# Patient Record
Sex: Male | Born: 1955
Health system: Southern US, Community
[De-identification: ages and names within clinical notes are randomized; demographics above are authoritative.]

## PROBLEM LIST (undated history)

## (undated) DIAGNOSIS — E785 Hyperlipidemia, unspecified: Secondary | ICD-10-CM

## (undated) DIAGNOSIS — E119 Type 2 diabetes mellitus without complications: Secondary | ICD-10-CM

## (undated) DIAGNOSIS — I10 Essential (primary) hypertension: Secondary | ICD-10-CM

## (undated) DIAGNOSIS — N529 Male erectile dysfunction, unspecified: Secondary | ICD-10-CM

## (undated) DIAGNOSIS — K219 Gastro-esophageal reflux disease without esophagitis: Secondary | ICD-10-CM

## (undated) HISTORY — DX: Hyperlipidemia, unspecified: E78.5

## (undated) HISTORY — DX: Essential (primary) hypertension: I10

## (undated) HISTORY — DX: Type 2 diabetes mellitus without complications: E11.9

## (undated) HISTORY — PX: HERNIA REPAIR: SHX51

## (undated) HISTORY — PX: OTHER SURGICAL HISTORY: SHX169

## (undated) HISTORY — DX: Gastro-esophageal reflux disease without esophagitis: K21.9

## (undated) HISTORY — DX: Male erectile dysfunction, unspecified: N52.9

## (undated) HISTORY — PX: TRIGGER FINGER RELEASE: SHX641

---

## 2011-01-17 HISTORY — PX: SMALL INTESTINE SURGERY: SHX150

## 2016-10-04 ENCOUNTER — Other Ambulatory Visit: Payer: Self-pay | Admitting: Internal Medicine

## 2016-10-04 ENCOUNTER — Ambulatory Visit
Admission: RE | Admit: 2016-10-04 | Discharge: 2016-10-04 | Disposition: A | Discharge status: Home / Self Care | Payer: Self-pay | Source: Ambulatory Visit | Attending: Internal Medicine | Admitting: Internal Medicine

## 2016-10-04 DIAGNOSIS — Z9289 Personal history of other medical treatment: Secondary | ICD-10-CM

## 2016-12-18 ENCOUNTER — Ambulatory Visit (INDEPENDENT_AMBULATORY_CARE_PROVIDER_SITE_OTHER): Payer: No Typology Code available for payment source | Admitting: Family Medicine

## 2016-12-18 ENCOUNTER — Encounter (INDEPENDENT_AMBULATORY_CARE_PROVIDER_SITE_OTHER): Payer: Self-pay | Admitting: Family Medicine

## 2016-12-18 VITALS — BP 164/75 | HR 64 | Temp 98.2°F | Ht 69.0 in | Wt 190.0 lb

## 2016-12-18 DIAGNOSIS — R011 Cardiac murmur, unspecified: Secondary | ICD-10-CM

## 2016-12-18 DIAGNOSIS — Z0000 Encounter for general adult medical examination without abnormal findings: Secondary | ICD-10-CM

## 2016-12-18 DIAGNOSIS — E119 Type 2 diabetes mellitus without complications: Secondary | ICD-10-CM

## 2016-12-18 DIAGNOSIS — R809 Proteinuria, unspecified: Secondary | ICD-10-CM | POA: Insufficient documentation

## 2016-12-18 DIAGNOSIS — I10 Essential (primary) hypertension: Secondary | ICD-10-CM

## 2016-12-18 LAB — POCT HEMOGLOBIN A1C: POCT Hgb A1C: 6.3 % — AB (ref 3.9–5.9)

## 2016-12-18 NOTE — Progress Notes (Signed)
Subjective:     Patient is a 61 y.o. male who presents for their routine comprehensive medical evaluation.  Patient concerns include:    Problem   Essential Hypertension    On 4 BP meds but control is poor.       Type 2 Diabetes Mellitus Without Complication, Without Long-Term Current Use of Insulin    On ACEi.  Do for A1c and eye exam. On low dose statin.  Generally avoids carbs and has achieved significant wt loss.     Systolic Ejection Murmur    Was told in the past but never f/u with cardiology.  No CP nor palpitations.       .      Allergies   Allergen Reactions   . Morphine Itching     Patient states that it feels like ants are all over him      Patient Active Problem List    Diagnosis Date Noted   . Essential hypertension 12/18/2016   . Type 2 diabetes mellitus without complication, without long-term current use of insulin 12/18/2016   . Systolic ejection murmur 12/18/2016     Past Medical History:   Diagnosis Date   . Diabetes mellitus    . Hyperlipidemia    . Hypertension        Last Colonoscopy:  2016   Consultants:  none   Last Dental:  09/2016   Last Optometry:  10/2015  Past Surgical History:   Procedure Laterality Date   . APPENDECTOMY     . COLONOSCOPY  2016   . HERNIA REPAIR      age 22-5   . SMALL INTESTINE SURGERY         Immunizations:   Immunization History   Administered Date(s) Administered   . Influenza (Im) Preservative Free 10/30/2016     Immunization status including dT, annual influenza, and herpes zoster: up to date and documented.      Social History   Substance Use Topics   . Smoking status: Light Tobacco Smoker   . Smokeless tobacco: Never Used      Comment: cigar x2 a month   . Alcohol use Yes      Comment: 1-2/month      Marrital status:    married    History   Sexual Activity   . Sexual activity: Yes   . Partners: Female   . Birth control/ protection: None     Religious:  Christian  Occupation:  Surgical tech  Seatbelts:  consistently  Habits:  Average daily coffee:  1 cups per   day, water: 2 liters per day and excersize:  2 times per week  Sleep:  Averages 7 hrs per night and feels well rested     Family History   Problem Relation Age of Onset   . Diabetes Mother    . Hypertension Father    . Cancer Maternal Grandmother         leukemia        Review of Systems  Pertinent items are noted in HPI.  All other system reviewed and are negative.    Objective:     Vitals:    12/18/16 1509   BP: 164/75   Pulse: 64   Temp: 98.2 F (36.8 C)     Body mass index is 28.06 kg/m.    Vitals reviewed  General Appearance:    Alert, cooperative, no distress, appears stated age   Head:    Normocephalic, without obvious  abnormality, atraumatic   Eyes:    PERRL, conjunctiva/corneas clear, EOM's intact, fundi     benign, both eyes   Ears:    Normal TM's and external ear canals, both ears   Nose:   Nares normal, septum midline, mucosa normal, no drainage     or sinus tenderness   Throat:   Lips, mucosa, and tongue normal; teeth and gums normal   Neck:   Supple, symmetrical, trachea midline, no adenopathy;     thyroid:  no enlargement/tenderness/nodules; no carotid    bruit or JVD   Back:     Symmetric, no curvature, ROM normal, no CVA tenderness   Lungs:     Clear to auscultation bilaterally, respirations unlabored   Chest Wall:    No tenderness or deformity    Heart:    Regular rate and rhythm, S1 and S2 normal, 2/6 SEM, rub    or gallop       Abdomen:     Soft, non-tender, bowel sounds active all four quadrants,     no masses, no organomegaly   Genitalia:    Normal penis and testes. No palpable hernia.  No testicular nor scrotal masses, no discharge nor tenderness.   Rectal:    deferred   Extremities:   Extremities normal, atraumatic, no cyanosis or edema   Pulses:   2+ and symmetric all extremities   Skin:   Skin color, texture, turgor normal, no rashes or lesions   Lymph nodes:   Cervical, supraclavicular, and axillary nodes normal   Neurologic:   CNII-XII intact, normal strength, sensation         ECG:   normal sinus rhythm, no blocks or conduction defects, no ischemic changes       Assessment:       ICD-10-CM    1. Preventative health care Z00.00 ECG 12 lead     CBC and differential     Comprehensive metabolic panel     Lipid panel     Urinalysis     CANCELED: CBC and differential     CANCELED: Comprehensive metabolic panel     CANCELED: Lipid panel     CANCELED: Urinalysis     CANCELED: Microalbumin, Random Urine   2. Essential hypertension I10 ECG 12 lead     Cardiology Referral: Debera Lat, MD ([facility])   3. Type 2 diabetes mellitus without complication, without long-term current use of insulin E11.9 POCT Hemoglobin A1C     Microalbumin, Random Urine     CANCELED: Microalbumin, Random Urine   4. Systolic ejection murmur R01.1 Cardiology Referral: Debera Lat, MD ([facility])         Plan:     Orders Placed This Encounter   Procedures   . CBC and differential   . Comprehensive metabolic panel   . Lipid panel   . Urinalysis   . Microalbumin, Random Urine   . Cardiology Referral: Debera Lat, MD ([facility])   . POCT Hemoglobin A1C   . ECG 12 lead         Discussed the patient's BMI with him.      HIgh BMI Follow Up   Encouragement to Exercise      MyChart access encouraged    The following recommendations are also here to ensure good health:  Follow-up per routine as discussed and as needed pending lab evaluation if indicated  Low fat and low cholesterol diet with limited sodium is normally appropriate  Adequate hydration of approximately 2 liters of  water daily  Avoid second-hand smoke exposure and quit if you smoke  Limit alcohol intake to less than 2 oz daily  Pursue adequate exercise to include 150 minutes of aerobic activity weekly  Consistent multivitamin use  Consistent seatbelt use  Semiannual dental evaluations  Eye examination every 1-2 years      Risk & Benefits of any new medication(s) were explained to the patient (and family) who verbalized understanding & agreed to the treatment plan. Patient  (family) encouraged to contact me/clinical staff with any questions/concerns    Raj Janus, MD

## 2016-12-18 NOTE — Patient Instructions (Signed)
Schedule a Comprehensive Medical and Physical Examination with our office annually.  This is different and separate from any problem-related visit.    MyChart access encouraged    Follow-up per routine as discussed and as needed pending lab evaluation  Obtain annual flu vaccinations  Low fat and low cholesterol diet with limited sodium but rich in fiber and vegitables  Adequate hydration of approximately 2 liters of water daily  Avoid second-hand smoke exposure  Avoid excess alcohol:  intake less than 2 oz daily or 14 drinks per week  Adequate exercise to include 150 minutes of aerobic activity weekly  Consistent multivitamin use  Consistent seatbelt use  Wear sunscreen with UVA and UVB protection SPF 30 or greater  Semiannual dental evaluations  Eye examination every 1-2 years  Reviewed testicular self-examinations    www.uptodate.com/patients is a great medical resource and it's free for you!  The [facility] website also has good information.  [facility]com    Risk & Benefits of any new medication(s) were explained to the patient (and family) who verbalized understanding & agreed to the treatment plan. Patient (family) encouraged to contact me/clinical staff with any questions/concerns

## 2016-12-19 ENCOUNTER — Encounter (INDEPENDENT_AMBULATORY_CARE_PROVIDER_SITE_OTHER): Payer: Self-pay | Admitting: Family Medicine

## 2016-12-20 ENCOUNTER — Encounter (INDEPENDENT_AMBULATORY_CARE_PROVIDER_SITE_OTHER): Payer: Self-pay | Admitting: Family Medicine

## 2016-12-25 ENCOUNTER — Other Ambulatory Visit (INDEPENDENT_AMBULATORY_CARE_PROVIDER_SITE_OTHER): Payer: Self-pay | Admitting: Family Medicine

## 2016-12-25 ENCOUNTER — Other Ambulatory Visit (FREE_STANDING_LABORATORY_FACILITY): Payer: No Typology Code available for payment source

## 2016-12-25 DIAGNOSIS — E119 Type 2 diabetes mellitus without complications: Secondary | ICD-10-CM

## 2016-12-25 DIAGNOSIS — I10 Essential (primary) hypertension: Secondary | ICD-10-CM

## 2016-12-25 DIAGNOSIS — Z0000 Encounter for general adult medical examination without abnormal findings: Secondary | ICD-10-CM

## 2016-12-25 LAB — CBC AND DIFFERENTIAL
Absolute NRBC: 0 10*3/uL
Basophils Absolute Automated: 0.09 10*3/uL (ref 0.00–0.20)
Basophils Automated: 1.3 %
Eosinophils Absolute Automated: 0.19 10*3/uL (ref 0.00–0.70)
Eosinophils Automated: 2.6 %
Hematocrit: 37.3 % — ABNORMAL LOW (ref 42.0–52.0)
Hgb: 12.2 g/dL — ABNORMAL LOW (ref 13.0–17.0)
Immature Granulocytes Absolute: 0.01 10*3/uL
Immature Granulocytes: 0.1 %
Lymphocytes Absolute Automated: 2.13 10*3/uL (ref 0.50–4.40)
Lymphocytes Automated: 29.7 %
MCH: 31.4 pg (ref 28.0–32.0)
MCHC: 32.7 g/dL (ref 32.0–36.0)
MCV: 95.9 fL (ref 80.0–100.0)
MPV: 9.8 fL (ref 9.4–12.3)
Monocytes Absolute Automated: 0.82 10*3/uL (ref 0.00–1.20)
Monocytes: 11.4 %
Neutrophils Absolute: 3.94 10*3/uL (ref 1.80–8.10)
Neutrophils: 54.9 %
Nucleated RBC: 0 /100 WBC (ref 0.0–1.0)
Platelets: 402 10*3/uL — ABNORMAL HIGH (ref 140–400)
RBC: 3.89 10*6/uL — ABNORMAL LOW (ref 4.70–6.00)
RDW: 13 % (ref 12–15)
WBC: 7.18 10*3/uL (ref 3.50–10.80)

## 2016-12-25 LAB — COMPREHENSIVE METABOLIC PANEL
ALT: 12 U/L (ref 0–55)
AST (SGOT): 16 U/L (ref 5–34)
Albumin/Globulin Ratio: 1.5 (ref 0.9–2.2)
Albumin: 4.1 g/dL (ref 3.5–5.0)
Alkaline Phosphatase: 98 U/L (ref 38–106)
BUN: 13 mg/dL (ref 9.0–28.0)
Bilirubin, Total: 0.4 mg/dL (ref 0.2–1.2)
CO2: 29 mEq/L (ref 21–29)
Calcium: 9.9 mg/dL (ref 8.5–10.5)
Chloride: 103 mEq/L (ref 100–111)
Creatinine: 1.3 mg/dL (ref 0.5–1.5)
Globulin: 2.7 g/dL (ref 2.0–3.7)
Glucose: 95 mg/dL (ref 70–100)
Potassium: 4.7 mEq/L (ref 3.5–5.1)
Protein, Total: 6.8 g/dL (ref 6.0–8.3)
Sodium: 140 mEq/L (ref 136–145)

## 2016-12-25 LAB — URINALYSIS
Bilirubin, UA: NEGATIVE
Blood, UA: NEGATIVE
Glucose, UA: NEGATIVE
Ketones UA: NEGATIVE
Leukocyte Esterase, UA: NEGATIVE
Nitrite, UA: NEGATIVE
Protein, UR: 100 — AB
Specific Gravity UA: 1.019 (ref 1.001–1.035)
Urine pH: 7 (ref 5.0–8.0)
Urobilinogen, UA: 0.2

## 2016-12-25 LAB — URINE MICROSCOPIC

## 2016-12-25 LAB — LIPID PANEL
Cholesterol / HDL Ratio: 3
Cholesterol: 109 mg/dL (ref 0–199)
HDL: 36 mg/dL — ABNORMAL LOW (ref 40–9999)
LDL Calculated: 56 mg/dL (ref 0–99)
Triglycerides: 83 mg/dL (ref 34–149)
VLDL Calculated: 17 mg/dL (ref 10–40)

## 2016-12-25 LAB — MICROALBUMIN, RANDOM URINE
Urine Creatinine, Random: 102.8 mg/dL
Urine Microalbumin, Random: 723 — ABNORMAL HIGH (ref 0.0–30.0)
Urine Microalbumin/Creatinine Ratio: 703 ug/mg — ABNORMAL HIGH (ref 0–30)

## 2016-12-25 LAB — HEMOLYSIS INDEX: Hemolysis Index: 8 (ref 0–18)

## 2016-12-25 LAB — GFR: EGFR: 56.1

## 2016-12-26 NOTE — Progress Notes (Signed)
Please call this patient to notify them that I would like to discuss their abnormal laboratory results at a follow-up appointment.

## 2017-01-16 HISTORY — PX: HERNIA REPAIR: SHX51

## 2017-01-23 ENCOUNTER — Encounter (INDEPENDENT_AMBULATORY_CARE_PROVIDER_SITE_OTHER): Payer: Self-pay | Admitting: Family Medicine

## 2017-01-23 ENCOUNTER — Ambulatory Visit (INDEPENDENT_AMBULATORY_CARE_PROVIDER_SITE_OTHER): Payer: No Typology Code available for payment source | Admitting: Family Medicine

## 2017-01-23 VITALS — BP 174/85 | HR 66 | Temp 98.2°F | Ht 69.0 in | Wt 196.0 lb

## 2017-01-23 DIAGNOSIS — E1129 Type 2 diabetes mellitus with other diabetic kidney complication: Secondary | ICD-10-CM

## 2017-01-23 DIAGNOSIS — R809 Proteinuria, unspecified: Secondary | ICD-10-CM

## 2017-01-23 DIAGNOSIS — I10 Essential (primary) hypertension: Secondary | ICD-10-CM

## 2017-01-23 MED ORDER — CARVEDILOL 25 MG PO TABS
25.00 mg | ORAL_TABLET | Freq: Two times a day (BID) | ORAL | 0 refills | Status: AC
Start: 2017-01-23 — End: ?

## 2017-01-23 MED ORDER — LISINOPRIL-HYDROCHLOROTHIAZIDE 20-12.5 MG PO TABS
2.00 | ORAL_TABLET | Freq: Every day | ORAL | 0 refills | Status: DC
Start: 2017-01-23 — End: 2017-05-12

## 2017-01-23 MED ORDER — HYDRALAZINE HCL 10 MG PO TABS
10.00 mg | ORAL_TABLET | Freq: Three times a day (TID) | ORAL | 0 refills | Status: DC
Start: 2017-01-23 — End: 2017-04-24

## 2017-01-23 MED ORDER — METFORMIN HCL 1000 MG PO TABS
1000.00 mg | ORAL_TABLET | Freq: Two times a day (BID) | ORAL | 0 refills | Status: DC
Start: 2017-01-23 — End: 2017-05-12

## 2017-01-23 MED ORDER — TERAZOSIN HCL 5 MG PO CAPS
5.00 mg | ORAL_CAPSULE | Freq: Every day | ORAL | 3 refills | Status: AC
Start: 2017-01-23 — End: ?

## 2017-01-23 MED ORDER — ATORVASTATIN CALCIUM 20 MG PO TABS
20.00 mg | ORAL_TABLET | Freq: Every day | ORAL | 3 refills | Status: AC
Start: 2017-01-23 — End: ?

## 2017-01-23 NOTE — Progress Notes (Signed)
Have you seen any specialists/other providers since your last visit with us?      No      Arm preference verified?     Yes    The patient is due for various.

## 2017-01-23 NOTE — Progress Notes (Signed)
Chief Complaint   Patient presents with   . Results     patient is here to follow up on recent labs         S:      Problem   Essential Hypertension    On 4 BP meds but control continues to be poor and will be following with cardiology for this issue.   Recalls problems with reaction to CCB.  Agreeable to trial hydralazine.        Type 2 Diabetes Mellitus With Microalbuminuria, Without Long-Term Current Use of Insulin    On ACEi.  Do for A1c and eye exam. On low dose statin.  Generally avoids carbs and has achieved significant wt loss.         Allergies   Allergen Reactions   . Morphine Itching     Patient states that it feels like ants are all over him       Current Outpatient Prescriptions   Medication Sig Dispense Refill   . aspirin EC 81 MG EC tablet Take 81 mg by mouth daily.     Marland Kitchen atorvastatin (LIPITOR) 20 MG tablet Take 1 tablet (20 mg total) by mouth daily. 90 tablet 3   . carvedilol (COREG) 25 MG tablet Take 1 tablet (25 mg total) by mouth 2 (two) times daily with meals. 180 tablet 0   . lisinopril-hydroCHLOROthiazide (PRINZIDE,ZESTORETIC) 20-12.5 MG per tablet Take 2 tablets by mouth daily. 180 tablet 0   . metFORMIN (GLUCOPHAGE) 1000 MG tablet Take 1 tablet (1,000 mg total) by mouth 2 (two) times daily with meals. 180 tablet 0   . terazosin (HYTRIN) 5 MG capsule Take 1 capsule (5 mg total) by mouth daily. 90 capsule 3   . hydrALAZINE (APRESOLINE) 10 MG tablet Take 1 tablet (10 mg total) by mouth 3 (three) times daily. 270 tablet 0     No current facility-administered medications for this visit.        Review of Systems   Constitutional: Negative for fever, malaise/fatigue and weight loss.   Cardiovascular: Negative for chest pain.   Gastrointestinal: Negative for nausea and vomiting.   Genitourinary: Negative for dysuria.   Musculoskeletal: Negative for myalgias.   Skin: Negative for rash.   Neurological: Negative for sensory change and headaches.   Endo/Heme/Allergies: Negative for polydipsia.   All  other systems reviewed and are negative.      All other systems were reviewed and are negative    O:  BP 174/85 (BP Site: Left arm, Patient Position: Sitting)   Pulse 66   Temp 98.2 F (36.8 C) (Oral)   Ht 1.753 m (5\' 9" )   Wt 88.9 kg (196 lb)   BMI 28.94 kg/m , Body mass index is 28.94 kg/m.  Vital signs reviewed  GEN:  NAD, nonobese  NEURO:  CN2-12 without focal defecit, nml gait and station  SKIN:  Warm, dry  EXT:  No edema, +2 radial pulse b/l    A/P:     1. Type 2 diabetes mellitus with microalbuminuria, without long-term current use of insulin  lisinopril-hydroCHLOROthiazide (PRINZIDE,ZESTORETIC) 20-12.5 MG per tablet    metFORMIN (GLUCOPHAGE) 1000 MG tablet   2. Essential hypertension  hydrALAZINE (APRESOLINE) 10 MG tablet    terazosin (HYTRIN) 5 MG capsule    atorvastatin (LIPITOR) 20 MG tablet    carvedilol (COREG) 25 MG tablet    lisinopril-hydroCHLOROthiazide (PRINZIDE,ZESTORETIC) 20-12.5 MG per tablet         Risk & Benefits of the  new medication(s) were explained to the patient (and family) who verbalized understanding & agreed to the treatment plan. Patient (family) encouraged to contact me/clinical staff with any questions/concerns    Midge Minium, MD

## 2017-02-08 ENCOUNTER — Ambulatory Visit (INDEPENDENT_AMBULATORY_CARE_PROVIDER_SITE_OTHER): Payer: No Typology Code available for payment source | Admitting: Cardiovascular Disease

## 2017-02-08 ENCOUNTER — Encounter (INDEPENDENT_AMBULATORY_CARE_PROVIDER_SITE_OTHER): Payer: Self-pay | Admitting: Cardiovascular Disease

## 2017-02-08 VITALS — BP 146/82 | HR 71 | Ht 69.0 in | Wt 194.8 lb

## 2017-02-08 DIAGNOSIS — I10 Essential (primary) hypertension: Secondary | ICD-10-CM

## 2017-02-08 DIAGNOSIS — R808 Other proteinuria: Secondary | ICD-10-CM

## 2017-02-08 MED ORDER — SPIRONOLACTONE 25 MG PO TABS
25.00 mg | ORAL_TABLET | Freq: Every day | ORAL | 11 refills | Status: AC
Start: 2017-02-08 — End: ?

## 2017-02-08 NOTE — Progress Notes (Signed)
[facility] CONSULTATION    I had the pleasure of seeing Mr. Ingalls today for cardiovascular evaluation. He is a pleasant 62 y.o. male with a history of HTN, DM, HDL. Patient has difficult to control blood pressure despite being on several blood pressure medications. History of HTN s    Diagnosed with HTN in 2010, but has been taking medications off and on since then. He was treated with anti-HTN medications from 2010 until 2015. Stopped taking anti-HTN medications from 2015-2017. He started again on treatment several months ago since moving to the area.     Patient has been taking blood pressures at home, sitting and resting: 154/68, 142/60s, 171/70s. Patient reports treatment has not helped his blood pressure. Does not take clonidine regularly, only when blood pressure is very high. Has not had renal scan for renal artery stenosis. No history of retinopathy, checked vision last year in September.  Protein noted in urine since 2018. 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 He reports some swelling in his legs due to standing all day, has been wearing compression stockings. Former smoker when he was in the [employer]. Eats diet with lots of fruits and vegetables, without much fast food. Walks once to twice per week.     PAST MEDICAL HISTORY: He has a past medical history of Diabetes mellitus; Hyperlipidemia; and Hypertension. He has a past surgical history that includes Small intestine surgery; Appendectomy; Hernia repair; and Colonoscopy (2016).    MEDICATIONS:   Current Outpatient Prescriptions   Medication Sig Dispense Refill   . aspirin EC 81 MG EC tablet Take 81 mg by mouth daily.     Marland Kitchen atorvastatin (LIPITOR) 20 MG tablet Take 1 tablet (20 mg total) by mouth daily. 90 tablet 3   . carvedilol (COREG) 25 MG tablet Take 1 tablet (25 mg total) by mouth 2 (two) times daily with meals. 180 tablet 0   . hydrALAZINE (APRESOLINE) 10 MG tablet Take 1 tablet (10 mg total) by mouth 3 (three) times  daily. 270 tablet 0   . lisinopril-hydroCHLOROthiazide (PRINZIDE,ZESTORETIC) 20-12.5 MG per tablet Take 2 tablets by mouth daily. 180 tablet 0   . metFORMIN (GLUCOPHAGE) 1000 MG tablet Take 1 tablet (1,000 mg total) by mouth 2 (two) times daily with meals. 180 tablet 0   . terazosin (HYTRIN) 5 MG capsule Take 1 capsule (5 mg total) by mouth daily. 90 capsule 3   . CLONIDINE HCL PO Take by mouth.       No current facility-administered medications for this visit.        ALLERGIES:   Allergies   Allergen Reactions   . Morphine Itching     Patient states that it feels like ants are all over him       FAMILY HISTORY: His family history includes Cancer in his maternal grandmother; Diabetes in his mother; Hypertension in his father.    SOCIAL HISTORY: He reports that he has been smoking Cigars.  He has never used smokeless tobacco. He reports that he drinks alcohol. He reports that he does not use drugs.    REVIEW OF SYSTEMS:   Derm: no rashes or ulcers  ID: no fevers, chills, or night sweats.  GI: no diarrhea or consipation  Psych: no suicidal or homocidal ideation  All other systems reviewed and negative except as stated above.     PHYSICAL EXAMINATION  General Appearance:  A well-appearing male in no acute distress, alert and oriented to person/place/situation.  Vital  Signs: BP 146/82 (BP Site: Right arm, Patient Position: Sitting, Cuff Size: Medium)   Pulse 71   Ht 1.753 m (5\' 9" )   Wt 88.4 kg (194 lb 12.8 oz)   SpO2 98%   BMI 28.77 kg/m    HEENT: Sclera anicteric, conjunctiva without pallor, moist mucous membranes, normal dentition. No arcus seniles.  Neck:  Supple without jugular venous distention. Thyroid nonpalpable. Normal carotid upstrokes without bruits.   Chest: Clear to auscultation bilaterally with good air movement and respiratory effort and no wheezes, rales, or rhonchi   Cardiovascular: RRR, normal S1 and physiologically split S2, No gallops or rub. PMI is nondisplaced. 1/6 aortic murmur  Abdomen:  Soft, nontender, nondistended, with normoactive bowel sounds. No organomegaly.  No pulsatile masses, or bruits.   Extremities: Warm without edema, clubbing, or cyanosis. All peripheral pulses are full and equal.   Skin: No rash, xanthoma or xanthelasma.   Neuro: Alert and oriented x3. Grossly intact. Strength is symmetrical. Normal mood and affect.     ECG: Normal sinus rhythm, normal axis      ECHOCARDIOGRAM: Ordered    STRESS: Ordered    LABS:     Lab Results   Component Value Date    CHOL 109 12/25/2016     Lab Results   Component Value Date    HDL 36 (L) 12/25/2016     Lab Results   Component Value Date    LDL 56 12/25/2016     Lab Results   Component Value Date    TRIG 83 12/25/2016        Lab Results   Component Value Date    CREAT 1.3 12/25/2016       Lab Results   Component Value Date    ALT 12 12/25/2016    AST 16 12/25/2016    ALKPHOS 98 12/25/2016    BILITOTAL 0.4 12/25/2016                      IMPRESSION/RECOMMENDATIONS: Mr. Reede is a 62 y.o. male with:    # HTN with proteinuria  - Will start on spironolactone, with follow up in 2-4 weeks with BMP and office visit   - Cont coreg 25mg  BID  - Cont lisinopril-hctz   - cont terazosin  - Cont hydralazine  - If BP controlled with the addition of spironolactone, will [place] hydralazine  - Will order stress test to rule out ischemia  - Echo given long-standing hypertension without appropriate control   - Check TTE to eval LVEF and rule out LVH

## 2017-02-12 ENCOUNTER — Other Ambulatory Visit (FREE_STANDING_LABORATORY_FACILITY): Payer: No Typology Code available for payment source

## 2017-02-12 ENCOUNTER — Other Ambulatory Visit: Payer: No Typology Code available for payment source

## 2017-02-12 ENCOUNTER — Encounter (INDEPENDENT_AMBULATORY_CARE_PROVIDER_SITE_OTHER): Payer: No Typology Code available for payment source | Admitting: Family

## 2017-02-12 DIAGNOSIS — I10 Essential (primary) hypertension: Secondary | ICD-10-CM

## 2017-02-12 LAB — BASIC METABOLIC PANEL
BUN: 17 mg/dL (ref 9.0–28.0)
CO2: 25 mEq/L (ref 21–29)
Calcium: 10.1 mg/dL (ref 8.5–10.5)
Chloride: 105 mEq/L (ref 100–111)
Creatinine: 1.1 mg/dL (ref 0.5–1.5)
Glucose: 119 mg/dL — ABNORMAL HIGH (ref 70–100)
Potassium: 5 mEq/L (ref 3.5–5.1)
Sodium: 140 mEq/L (ref 136–145)

## 2017-02-12 LAB — GFR: EGFR: >60

## 2017-02-12 LAB — LIPID PANEL
Cholesterol / HDL Ratio: 3.5
Cholesterol: 128 mg/dL (ref 0–199)
HDL: 37 mg/dL — ABNORMAL LOW (ref 40–9999)
LDL Calculated: 69 mg/dL (ref 0–99)
Triglycerides: 109 mg/dL (ref 34–149)
VLDL Calculated: 22 mg/dL (ref 10–40)

## 2017-02-12 LAB — HEMOLYSIS INDEX: Hemolysis Index: 5 (ref 0–18)

## 2017-02-12 LAB — B-TYPE NATRIURETIC PEPTIDE: B-Natriuretic Peptide: 44 pg/mL (ref 0–100)

## 2017-02-13 ENCOUNTER — Other Ambulatory Visit: Payer: Self-pay | Admitting: Cardiovascular Disease

## 2017-02-14 ENCOUNTER — Telehealth (INDEPENDENT_AMBULATORY_CARE_PROVIDER_SITE_OTHER): Payer: Self-pay | Admitting: Family Medicine

## 2017-02-14 ENCOUNTER — Encounter (INDEPENDENT_AMBULATORY_CARE_PROVIDER_SITE_OTHER): Payer: Self-pay

## 2017-02-15 ENCOUNTER — Ambulatory Visit (INDEPENDENT_AMBULATORY_CARE_PROVIDER_SITE_OTHER): Payer: No Typology Code available for payment source | Admitting: Family Medicine

## 2017-02-15 ENCOUNTER — Encounter (INDEPENDENT_AMBULATORY_CARE_PROVIDER_SITE_OTHER): Payer: Self-pay | Admitting: Orthopaedic Surgery

## 2017-02-15 ENCOUNTER — Encounter (INDEPENDENT_AMBULATORY_CARE_PROVIDER_SITE_OTHER): Payer: Self-pay | Admitting: Family Medicine

## 2017-02-15 ENCOUNTER — Encounter (INDEPENDENT_AMBULATORY_CARE_PROVIDER_SITE_OTHER): Payer: Self-pay | Admitting: Cardiovascular Disease

## 2017-02-15 ENCOUNTER — Ambulatory Visit (INDEPENDENT_AMBULATORY_CARE_PROVIDER_SITE_OTHER): Payer: No Typology Code available for payment source

## 2017-02-15 ENCOUNTER — Ambulatory Visit
Admission: RE | Admit: 2017-02-15 | Discharge: 2017-02-15 | Disposition: A | Discharge status: Home / Self Care | Payer: No Typology Code available for payment source | Source: Ambulatory Visit | Attending: Cardiovascular Disease | Admitting: Cardiovascular Disease

## 2017-02-15 ENCOUNTER — Ambulatory Visit (INDEPENDENT_AMBULATORY_CARE_PROVIDER_SITE_OTHER): Payer: No Typology Code available for payment source | Admitting: Orthopaedic Surgery

## 2017-02-15 VITALS — BP 144/64 | HR 62 | Temp 98.5°F | Ht 67.5 in | Wt 195.0 lb

## 2017-02-15 VITALS — BP 216/80 | HR 67 | Ht 69.0 in | Wt 198.0 lb

## 2017-02-15 DIAGNOSIS — I350 Nonrheumatic aortic (valve) stenosis: Secondary | ICD-10-CM | POA: Insufficient documentation

## 2017-02-15 DIAGNOSIS — I10 Essential (primary) hypertension: Secondary | ICD-10-CM

## 2017-02-15 DIAGNOSIS — I77810 Thoracic aortic ectasia: Secondary | ICD-10-CM | POA: Insufficient documentation

## 2017-02-15 DIAGNOSIS — R809 Proteinuria, unspecified: Secondary | ICD-10-CM

## 2017-02-15 DIAGNOSIS — E1129 Type 2 diabetes mellitus with other diabetic kidney complication: Secondary | ICD-10-CM

## 2017-02-15 DIAGNOSIS — M25552 Pain in left hip: Secondary | ICD-10-CM

## 2017-02-15 DIAGNOSIS — I119 Hypertensive heart disease without heart failure: Secondary | ICD-10-CM | POA: Insufficient documentation

## 2017-02-15 NOTE — Progress Notes (Signed)
Chief Complaint   Patient presents with   . Hypertension     f/u         S:      Problem   Essential Hypertension    On 4 BP meds but control continues to be poor and will be following with cardiology for this issue.   Recalls problems with reaction to CCB.  Agreeable to trial hydralazine.     Seen by cardiology and started on spironolactone 2 days ago.  BP more effectively controlled.         Allergies   Allergen Reactions   . Morphine Itching     Patient states that it feels like ants are all over him       Current Outpatient Prescriptions   Medication Sig Dispense Refill   . aspirin EC 81 MG EC tablet Take 81 mg by mouth daily.     Marland Kitchen atorvastatin (LIPITOR) 20 MG tablet Take 1 tablet (20 mg total) by mouth daily. 90 tablet 3   . carvedilol (COREG) 25 MG tablet Take 1 tablet (25 mg total) by mouth 2 (two) times daily with meals. 180 tablet 0   . hydrALAZINE (APRESOLINE) 10 MG tablet Take 1 tablet (10 mg total) by mouth 3 (three) times daily. 270 tablet 0   . lisinopril-hydroCHLOROthiazide (PRINZIDE,ZESTORETIC) 20-12.5 MG per tablet Take 2 tablets by mouth daily. 180 tablet 0   . metFORMIN (GLUCOPHAGE) 1000 MG tablet Take 1 tablet (1,000 mg total) by mouth 2 (two) times daily with meals. 180 tablet 0   . spironolactone (ALDACTONE) 25 MG tablet Take 1 tablet (25 mg total) by mouth daily. 30 tablet 11   . terazosin (HYTRIN) 5 MG capsule Take 1 capsule (5 mg total) by mouth daily. 90 capsule 3     No current facility-administered medications for this visit.        Review of Systems   Constitutional: Negative for fever and weight loss.   HENT: Negative for sore throat.    Respiratory: Negative for cough and shortness of breath.    Cardiovascular: Negative for chest pain and palpitations.   Gastrointestinal: Negative for nausea and vomiting.   Musculoskeletal: Positive for joint pain. Negative for myalgias.   Skin: Negative for rash.   All other systems reviewed and are negative.      All other systems were reviewed and  are negative    O:  BP 144/64   Pulse 62   Temp 98.5 F (36.9 C) (Oral)   Ht 1.715 m (5' 7.5")   Wt 88.5 kg (195 lb)   BMI 30.09 kg/m , Body mass index is 30.09 kg/m.  Vital signs reviewed  GEN:  NAD, nonobese  NEURO:  CN2-12 without focal defecit, nml gait and station  CVS:  rrr -m/r/g  SKIN:  Warm, dry  EXT:  No edema, +2 radial pulse b/l    A/P:     1. Type 2 diabetes mellitus with microalbuminuria, without long-term current use of insulin  Not yet due for A1c   2. Essential hypertension  Improved but not yet at target  Will allow the current medications to take effect  F/u 2 months BP recheck and A1c         Risk & Benefits of the new medication(s) were explained to the patient (and family) who verbalized understanding & agreed to the treatment plan. Patient (family) encouraged to contact me/clinical staff with any questions/concerns    Raj Janus, MD

## 2017-02-15 NOTE — Progress Notes (Signed)
[facility]    Provider: Marjory Sneddon, MD  Date of Exam:  02/15/2017   Patient:  Ricky Cruz  DOB:  [birth year]    AGE:  62 y.o.  MR#:  [id number]     Chief Complaint: Left hip pain.    HPI: Ricky Cruz is a pleasant 62 y.o. male who presents with Left hip pain that he has had for several year.  He is a Research scientist (life sciences).  He reports that the hip pain has been related to work place accidents such as falling off ladders and flight decks.  Pain is an 8 out of 10 on a consistent basis located to the groin.  He feels that he has loss of range of motion, loss of strength.  He denies any numbness or tingling. Denies low back pain. No changes to bowl or bladder function.      Club/School/Work Affiliation: None    Problem List:   Patient Active Problem List   Diagnosis   . Essential hypertension   . Type 2 diabetes mellitus with microalbuminuria, without long-term current use of insulin   . Systolic ejection murmur   . Other proteinuria        Past Medical History:   Past Medical History:   Diagnosis Date   . Diabetes mellitus    . Hyperlipidemia    . Hypertension        Social History:   Social History   Substance Use Topics   . Smoking status: Light Tobacco Smoker     Types: Cigars   . Smokeless tobacco: Never Used      Comment: cigar x2 a month   . Alcohol use Yes      Comment: eiher glass of wine or can of beer1-2/month       Family History:   Family History   Problem Relation Age of Onset   . Diabetes Mother    . Hypertension Father    . Cancer Maternal Grandmother         leukemia       Past Surgical History:   Past Surgical History:   Procedure Laterality Date   . APPENDECTOMY     . COLONOSCOPY  2016   . HERNIA REPAIR      age 1-5   . SMALL INTESTINE SURGERY         Medications:   Current Outpatient Prescriptions:   .  aspirin EC 81 MG EC tablet, Take 81 mg by mouth daily., Disp: , Rfl:   .  atorvastatin (LIPITOR) 20 MG tablet, Take 1 tablet (20 mg total) by mouth daily., Disp: 90  tablet, Rfl: 3  .  carvedilol (COREG) 25 MG tablet, Take 1 tablet (25 mg total) by mouth 2 (two) times daily with meals., Disp: 180 tablet, Rfl: 0  .  hydrALAZINE (APRESOLINE) 10 MG tablet, Take 1 tablet (10 mg total) by mouth 3 (three) times daily., Disp: 270 tablet, Rfl: 0  .  lisinopril-hydroCHLOROthiazide (PRINZIDE,ZESTORETIC) 20-12.5 MG per tablet, Take 2 tablets by mouth daily., Disp: 180 tablet, Rfl: 0  .  metFORMIN (GLUCOPHAGE) 1000 MG tablet, Take 1 tablet (1,000 mg total) by mouth 2 (two) times daily with meals., Disp: 180 tablet, Rfl: 0  .  spironolactone (ALDACTONE) 25 MG tablet, Take 1 tablet (25 mg total) by mouth daily., Disp: 30 tablet, Rfl: 11  .  terazosin (HYTRIN) 5 MG capsule, Take 1 capsule (5 mg total) by mouth daily., Disp: 90 capsule,  Rfl: 3    Allergies:   Allergies   Allergen Reactions   . Morphine Itching     Patient states that it feels like ants are all over him       ROS:  Constitutional: No fatigue, fever, weight loss, or weight gain.   Ears, Nose, Mouth & Throat: No sore throat or hearing loss.   Cardiovascular: No chest pain, blood clots, or leg cramps.   Respiratory: No shortness of breath, cough, or difficulty breathing.   Gastrointestinal: No nausea, vomiting, diarrhea, or loss of appetite.   Genitourinary: No polyuria or kidney disease.   Musculoskeletal: No joint aches, muscle weakness or swelling of joints/body parts other than that mentioned above/below.  Integumentary: No finger nail changes or skin dryness.   Neurological: No numbness, burning discomfort, or headaches.   Psychiatric: No depression or anxiety.   Endocrine: No increased thirst, change in appetite or thyroid disease.   Hematologic/Lymphatic: No easy bruising or anemia.     EXAM:     Left Hip Exam:    Skin intact  Erythema: None  Swelling: None  Effusion: None  Deformities: None  Leg Length Discrepancy: Negative  ROM: FF: 90  ABD: 40 ER: 30  IR: 20  Drehmann's (Passive hip ER when hip flexed): Positive  IP TTP:  Negative  Greater Trochanter TTP: Negative  Other TTP: None    Log Roll: Negative  Stinchfield (Resisted SLR): Positive  FADIR (impingement Test): Positive  FABER Ricky Cruz): Positive  EXT/ER Posterior Impingement: Negative  Internal Snapping: Negative  External Snapping: Negative  Thomas Test: Negative  Ober Test: Positive  Popliteal Angle (30) degrees  Straight Leg Raise: Negative  Crossed Straight Leg Raise: Negative    Strength: WNL    Other areas of Tenderness: None  DTR and Pathological Reflexes Intact  No lymphadenopathy  Sensation Intact to light touch all distributions of leg and foot  DF, PF, EHL motor intact  Foot Perfused with CR <2 sec  DP/PT pulse 2+    Right Hip Exam:    Skin intact  Erythema: None  Swelling: None  Effusion: None  Deformities: None  Leg Length Discrepancy: Negative  ROM: FF: 90 ABD: 40 ER: 30  IR: 20  Drehmann's (Passive hip ER when hip flexed): Negative  IP TTP: Negative  Greater Trochanter TTP: Negative  Other TTP: None    Strength: WNL    Other areas of Tenderness: None  DTR and Pathological Reflexes Intact  No lymphadenopathy  Sensation Intact to light touch all distributions of leg and foot  DF, PF, EHL motor intact  Foot Perfused with CR <2 sec  DP/PT pulse 2+    l-Spine Stance: There is negative asymmetry, negative atrophy, normal pelvic obliquity and normal spinal alignment.    Vitals: BP (!) 216/80   Pulse 67   Ht 1.753 m (5\' 9" )   Wt 89.8 kg (198 lb)   BMI 29.24 kg/m     General: Ricky Cruz was pleasant, oriented, easily engaged, displayed logical thinking with clear speech and was neat in appearance. His general appearance was normal, well-developed, well-nourished and appeared their stated age.    Gait: The patient demonstrated non antalgic gait with intact coordination and balance.     STUDIES: AP pelvis and frog leg lateral and false profile views of the left hip were ordered and reviewed on today's visit. They reveal a negative cross over sign, negative ischial spine  sign, negative coxa profunda, negative acetabular protrusion, Tonnis Grade 2,  positive joint space narrowing < 2 mm.    ASSESSMENT/PLAN: Ricky Cruz is a pleasant 62 y.o. male with historical and clinical evidence of left hip Cruz.  The patient's diagnosis and treatment options were discussed in detail at today's visit.  I have taken this opportunity to refer the patient to formal physical therapy.  I have asked the patient to avoid high impact activities and deep hip flexed positions if possible.  He has had 3 CSI in the past with some relief of sx.  We reviewed the standard conservative and surgical management of his diagnosis as well as the use of medications such as Tylenol and NSAIDS.  We discussed the limited role of injection therapy in management and its potential role in diagnosis and short term pain relief.  The potential need for surgical intervention was described in detail including the procedure, risks, rehab and recovery.  All questions were answered to the patient's satisfaction.  The patient will notify my clinic of any changes or worsening of their symptoms during the interim.  We will plan on follow up prn.    More than 30 minutes was spent with the patient at today's visit with more than 50% of that time spent discussing the patient's diagnosis, alternative treatments, potential outcomes and recommended treatment plan.

## 2017-02-15 NOTE — Progress Notes (Signed)
Have you seen any specialists/other providers since your last visit with us?      No      Arm preference verified?     Yes    The patient is due for eye exam, foot exam, shingles vaccine and PCMH letter

## 2017-02-16 ENCOUNTER — Other Ambulatory Visit: Payer: No Typology Code available for payment source

## 2017-02-17 ENCOUNTER — Encounter (INDEPENDENT_AMBULATORY_CARE_PROVIDER_SITE_OTHER): Payer: Self-pay | Admitting: Family Medicine

## 2017-02-21 ENCOUNTER — Encounter (INDEPENDENT_AMBULATORY_CARE_PROVIDER_SITE_OTHER): Payer: No Typology Code available for payment source | Admitting: Family

## 2017-02-21 ENCOUNTER — Ambulatory Visit: Payer: No Typology Code available for payment source

## 2017-04-24 ENCOUNTER — Other Ambulatory Visit (INDEPENDENT_AMBULATORY_CARE_PROVIDER_SITE_OTHER): Payer: Self-pay | Admitting: Family Medicine

## 2017-04-24 DIAGNOSIS — I10 Essential (primary) hypertension: Secondary | ICD-10-CM

## 2017-05-09 ENCOUNTER — Encounter (INDEPENDENT_AMBULATORY_CARE_PROVIDER_SITE_OTHER): Payer: Self-pay | Admitting: Family Medicine

## 2017-05-09 ENCOUNTER — Ambulatory Visit (INDEPENDENT_AMBULATORY_CARE_PROVIDER_SITE_OTHER): Payer: Self-pay | Admitting: Family Medicine

## 2017-05-09 VITALS — BP 148/73 | HR 63 | Temp 98.6°F | Ht 67.5 in | Wt 196.6 lb

## 2017-05-09 DIAGNOSIS — K4090 Unilateral inguinal hernia, without obstruction or gangrene, not specified as recurrent: Secondary | ICD-10-CM

## 2017-05-09 NOTE — Progress Notes (Signed)
Have you seen any specialists/other providers since your last visit with [place]?    No    Arm preference verified?   Yes    The patient is due for foot exam, pap smear, shingles vaccine and hep c screen.

## 2017-05-09 NOTE — Progress Notes (Signed)
Chief Complaint   Patient presents with   . Groin Pain     Pt c/o right side groin pain, pt states he is having inguinal hernia symptoms         S:    This is a new problem for me:  Pt notes inguinal pain ongoing about 3 weeks.  Has felt bulge.  Intermittent.  Has "reduced" this himself on a couple occasions.  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  Ordered truss on line as currently without insurance and is attempting to defer surgery until he has this completed.  He is not obese.      Allergies   Allergen Reactions   . Morphine Itching     Patient states that it feels like ants are all over him       Current Outpatient Prescriptions   Medication Sig Dispense Refill   . aspirin EC 81 MG EC tablet Take 81 mg by mouth daily.     Marland Kitchen atorvastatin (LIPITOR) 20 MG tablet Take 1 tablet (20 mg total) by mouth daily. 90 tablet 3   . carvedilol (COREG) 25 MG tablet Take 1 tablet (25 mg total) by mouth 2 (two) times daily with meals. 180 tablet 0   . hydrALAZINE (APRESOLINE) 10 MG tablet TAKE 1 TABLET BY MOUTH THREE TIMES A DAY 270 tablet 0   . lisinopril-hydroCHLOROthiazide (PRINZIDE,ZESTORETIC) 20-12.5 MG per tablet Take 2 tablets by mouth daily. 180 tablet 0   . metFORMIN (GLUCOPHAGE) 1000 MG tablet Take 1 tablet (1,000 mg total) by mouth 2 (two) times daily with meals. 180 tablet 0   . spironolactone (ALDACTONE) 25 MG tablet Take 1 tablet (25 mg total) by mouth daily. 30 tablet 11   . terazosin (HYTRIN) 5 MG capsule Take 1 capsule (5 mg total) by mouth daily. 90 capsule 3     No current facility-administered medications for this visit.        Review of Systems   Constitutional: Negative for fever and weight loss.   HENT: Negative for sore throat.    Eyes: Negative for blurred vision.   Respiratory: Negative for cough and shortness of breath.    Cardiovascular: Negative for chest pain and palpitations.   Gastrointestinal: Positive for abdominal pain. Negative for nausea and  vomiting.   Genitourinary: Negative for dysuria and urgency.   Musculoskeletal: Negative for myalgias.   Skin: Negative for rash.   Neurological: Negative for headaches.   All other systems reviewed and are negative.      All other systems were reviewed and are negative    O:  BP 148/73   Pulse 63   Temp 98.6 F (37 C) (Oral)   Ht 1.715 m (5' 7.5")   Wt 89.2 kg (196 lb 9.6 oz)   BMI 30.34 kg/m , Body mass index is 30.34 kg/m.  Vital signs reviewed  GEN:  NAD, nonobese  NEURO:  CN2-12 without focal defecit, nml gait and station  ABD:  Soft, NT, ND, +BS  GU: right superior inguinal canal with palpable slightly tender mass c/w inguinal hernia.  Normal testes and penis.  No signs of incarceration of hernia  SKIN:  Warm, dry  EXT:  No edema, +2 radial pulse b/l    A/P:     1. Right inguinal hernia  Pt to use truss  Recommend f/u with surgery for repair  Discussed indications for ED visit  Risk & Benefits of the new medication(s) were explained to the patient (and family) who verbalized understanding & agreed to the treatment plan. Patient (family) encouraged to contact me/clinical staff with any questions/concerns    Midge Minium, MD

## 2017-05-12 ENCOUNTER — Other Ambulatory Visit (INDEPENDENT_AMBULATORY_CARE_PROVIDER_SITE_OTHER): Payer: Self-pay | Admitting: Family Medicine

## 2017-05-12 DIAGNOSIS — I10 Essential (primary) hypertension: Secondary | ICD-10-CM

## 2017-05-12 DIAGNOSIS — E1129 Type 2 diabetes mellitus with other diabetic kidney complication: Secondary | ICD-10-CM

## 2017-05-15 ENCOUNTER — Encounter (INDEPENDENT_AMBULATORY_CARE_PROVIDER_SITE_OTHER): Payer: Self-pay | Admitting: Family Medicine

## 2017-05-15 ENCOUNTER — Ambulatory Visit (INDEPENDENT_AMBULATORY_CARE_PROVIDER_SITE_OTHER): Payer: Self-pay | Admitting: Family Medicine

## 2017-05-15 VITALS — BP 151/68 | HR 77 | Temp 97.6°F | Ht 67.5 in | Wt 198.0 lb

## 2017-05-15 DIAGNOSIS — K4090 Unilateral inguinal hernia, without obstruction or gangrene, not specified as recurrent: Secondary | ICD-10-CM

## 2017-05-15 NOTE — Progress Notes (Signed)
Have you seen any specialists/other providers since your last visit with us?      No      Arm preference verified?     Yes    The patient is due for various.

## 2017-05-15 NOTE — Progress Notes (Signed)
Chief Complaint   Patient presents with   . Hernia     follow up         S:    Pt notes inguinal pain ongoing about 4 weeks.  Was seen for this about 1 week ago when he felt bulge.  Intermittent.  Has "reduced" this himself on a couple occasions.  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  My last evaluation confirmed this.  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  He is currently without insurance and is attempting to defer surgery until he has this completed and therefore is pursuing a leave of absence for about 1 month to have this occur.  He is researching potential surgeons and alternate means to cover his expense.  He is not obese.      Allergies   Allergen Reactions   . Morphine Itching     Patient states that it feels like ants are all over him       Current Outpatient Prescriptions   Medication Sig Dispense Refill   . aspirin EC 81 MG EC tablet Take 81 mg by mouth daily.     Marland Kitchen atorvastatin (LIPITOR) 20 MG tablet Take 1 tablet (20 mg total) by mouth daily. 90 tablet 3   . carvedilol (COREG) 25 MG tablet Take 1 tablet (25 mg total) by mouth 2 (two) times daily with meals. 180 tablet 0   . hydrALAZINE (APRESOLINE) 10 MG tablet TAKE 1 TABLET BY MOUTH THREE TIMES A DAY 270 tablet 0   . lisinopril-hydroCHLOROthiazide (PRINZIDE,ZESTORETIC) 20-12.5 MG per tablet TAKE 2 TABLETS BY MOUTH EVERY DAY 180 tablet 0   . metFORMIN (GLUCOPHAGE) 1000 MG tablet TAKE 1 TABLET BY MOUTH TWICE A DAY WITH MEALS 180 tablet 0   . spironolactone (ALDACTONE) 25 MG tablet Take 1 tablet (25 mg total) by mouth daily. 30 tablet 11   . terazosin (HYTRIN) 5 MG capsule Take 1 capsule (5 mg total) by mouth daily. 90 capsule 3     No current facility-administered medications for this visit.        Review of Systems   Constitutional: Negative  for fever and weight loss.   HENT: Negative for sore throat.    Respiratory: Negative for cough and shortness of breath.    Cardiovascular: Negative for chest pain and palpitations.   Gastrointestinal: Negative for nausea and vomiting.   Genitourinary: Negative for dysuria, frequency and urgency.   Musculoskeletal: Positive for myalgias.   Skin: Negative for rash.   Neurological: Negative for headaches.   All other systems reviewed and are negative.      All other systems were reviewed and are negative    O:  BP 151/68 (BP Site: Left arm, Patient Position: Sitting)   Pulse 77   Temp 97.6 F (36.4 C) (Oral)   Ht 1.715 m (5' 7.5")   Wt 89.8 kg (198 lb)   BMI 30.55 kg/m , Body mass index is 30.55 kg/m.  Vital signs reviewed  GEN:  NAD, nonobese  NEURO:  CN2-12 without focal defecit, nml gait and station  CVS:  rrr -m/r/g  ABD:  Soft, NT, ND, +BS  GU: right superior inguinal canal with palpable slightly tender mass c/w inguinal hernia.  Normal testes and penis.  No signs  of incarceration of hernia  SKIN:  Warm, dry  EXT:  No edema, +2 radial pulse b/l    A/P:     1. Right inguinal hernia  Ccm truss  Proceed to ED if needed  F/u sgy for repair  Approved, from my perspective, bedrest until repair           Risk & Benefits of the new medication(s) were explained to the patient (and family) who verbalized understanding & agreed to the treatment plan. Patient (family) encouraged to contact me/clinical staff with any questions/concerns    Raj Janus, MD

## 2017-05-16 ENCOUNTER — Encounter (INDEPENDENT_AMBULATORY_CARE_PROVIDER_SITE_OTHER): Payer: Self-pay | Admitting: Family Medicine

## 2017-05-16 ENCOUNTER — Telehealth (INDEPENDENT_AMBULATORY_CARE_PROVIDER_SITE_OTHER): Payer: Self-pay | Admitting: Family Medicine

## 2017-05-16 NOTE — Telephone Encounter (Signed)
Pt wants to come in Friday or Monday for a Pre Op Visit but you only have 15 mins slots available. Please let me know if its ok to schedule him.

## 2017-05-17 NOTE — Telephone Encounter (Signed)
Done

## 2017-05-17 NOTE — Telephone Encounter (Signed)
Ricky Cruz, please use the 3:45 and 4:15 same day spots for this Monday/YP

## 2017-05-17 NOTE — Telephone Encounter (Signed)
Liam please advise.

## 2017-05-17 NOTE — Telephone Encounter (Signed)
We should involve Liam on this one.  I can try to get him in but this patient was recently in a discussion with him regarding this issue.

## 2017-05-21 ENCOUNTER — Encounter (INDEPENDENT_AMBULATORY_CARE_PROVIDER_SITE_OTHER): Payer: Self-pay | Admitting: Family Medicine

## 2017-05-21 ENCOUNTER — Ambulatory Visit (FREE_STANDING_LABORATORY_FACILITY): Payer: Self-pay | Admitting: Family Medicine

## 2017-05-21 VITALS — BP 141/64 | HR 58 | Temp 98.1°F | Ht 67.75 in | Wt 197.0 lb

## 2017-05-21 DIAGNOSIS — E875 Hyperkalemia: Secondary | ICD-10-CM

## 2017-05-21 DIAGNOSIS — Z01818 Encounter for other preprocedural examination: Secondary | ICD-10-CM

## 2017-05-21 DIAGNOSIS — K4090 Unilateral inguinal hernia, without obstruction or gangrene, not specified as recurrent: Secondary | ICD-10-CM

## 2017-05-21 NOTE — Progress Notes (Signed)
Have you seen any specialists/other providers since your last visit with [place]?    No    Arm preference verified?   Yes    The patient is due for eye exam, foot exam and PCMH letter, hep c screen  Pt stated surgeons office will accept ECHO completed on 02/15/17.

## 2017-05-21 NOTE — Progress Notes (Addendum)
Subjective:  The patient is a 62 y.o. male presenting today for preoperative medical evaluation and clearance for right inguinal hernia surgery at the request of Dr. Blair Heys.  This procedure will be done on 05/31/2017 and will take place at [facility].  The procedure will be done using general anesthesia.  The patient has no history of complications from this type of anesthesia in the past.    Patient Active Problem List   Diagnosis   . Essential hypertension   . Type 2 diabetes mellitus with microalbuminuria, without long-term current use of insulin   . Systolic ejection murmur   . Other proteinuria   . Right inguinal hernia     Past Medical History:   Diagnosis Date   . Diabetes mellitus    . Hyperlipidemia    . Hypertension       Past Surgical History:   Procedure Laterality Date   . APPENDECTOMY     . COLONOSCOPY  2016   . HERNIA REPAIR      age [age]   . SMALL INTESTINE SURGERY          Current Outpatient Prescriptions   Medication Sig Dispense Refill   . aspirin EC 81 MG EC tablet Take 81 mg by mouth daily.     Marland Kitchen atorvastatin (LIPITOR) 20 MG tablet Take 1 tablet (20 mg total) by mouth daily. 90 tablet 3   . carvedilol (COREG) 25 MG tablet Take 1 tablet (25 mg total) by mouth 2 (two) times daily with meals. 180 tablet 0   . hydrALAZINE (APRESOLINE) 10 MG tablet TAKE 1 TABLET BY MOUTH THREE TIMES A DAY 270 tablet 0   . lisinopril-hydroCHLOROthiazide (PRINZIDE,ZESTORETIC) 20-12.5 MG per tablet TAKE 2 TABLETS BY MOUTH EVERY DAY 180 tablet 0   . metFORMIN (GLUCOPHAGE) 1000 MG tablet TAKE 1 TABLET BY MOUTH TWICE A DAY WITH MEALS 180 tablet 0   . spironolactone (ALDACTONE) 25 MG tablet Take 1 tablet (25 mg total) by mouth daily. 30 tablet 11   . terazosin (HYTRIN) 5 MG capsule Take 1 capsule (5 mg total) by mouth daily. 90 capsule 3     No current facility-administered medications for this visit.      Allergies   Allergen Reactions   . Morphine Itching     Patient states that it feels like ants are all  over him        Social History   Substance Use Topics   . Smoking status: Light Tobacco Smoker     Types: Cigars   . Smokeless tobacco: Never Used      Comment: cigar x2 a month   . Alcohol use Yes      Comment: eiher glass of wine or can of beer1-2/month      Family History   Problem Relation Age of Onset   . Diabetes Mother    . Hypertension Father    . Cancer Maternal Grandmother         leukemia        Review of Systems  Pertinent items are noted in HPI.  All other system reviewed and are negative except as noted above.    Objective:  BP 141/64   Pulse (!) 58   Temp 98.1 F (36.7 C) (Oral)   Ht 1.721 m (5' 7.75")   Wt 89.4 kg (197 lb)   BMI 30.18 kg/m   General appearance: alert, appears stated age and cooperative  Throat: lips, mucosa, and tongue normal; teeth  and gums normal, poor dentition  Lungs: clear to auscultation bilaterally  Heart: regular rate and rhythm, S1, S2 normal, no murmur, click, rub or gallop  Neurologic: Grossly normal    ECG:  Pt reports recent echocardiogram which was told will suffice for his cardiac clearance       Assessment/Recommendation(s):  1. Preop examination    2. Right inguinal hernia        Orders Placed This Encounter   Procedures   . Comprehensive metabolic panel     Order Specific Question:   Has the patient fasted?     Answer:   No   . Hemolysis index     Has the patient fasted?->No   . GFR     Has the patient fasted?->No       The patient was informed that risks include, but are not limited to: complications from general anesthesia, death, excessive or uncontrolled bleeding, and sepsis. Any of these could require further intervention and or surgery. Other risks include DVT, PE, pneumonia, and wound infection.  There is no evidence of unstable angina, decompensated heart failure, or severe arrythmia.    The patient is an acceptable risk for the planned surgery.  Lab work is currently pending and will need to be reviewed before the patient can receive final clearance  for surgery.  An addendum to this note will be made once that laboratory evaluation has been completed.  Otherwise, all medical conditions have been optimized.    If I would be able to provide additional information regarding this patient, please do not hesitate to contact me.        Sincerely,    Lonzo Cloud. Letricia Krinsky MD, PhD  [facility] [place]  [phone]    The patient is an acceptable risk for the planned surgery.  He is cleared for the aforementioned procedure and all medical conditions have been optimized.    If I would be able to provide additional information regarding this patient, please do not hesitate to contact me.        Sincerely,    Lonzo Cloud. Roselin Wiemann MD, PhD  [facility] [place]  [phone]

## 2017-05-22 LAB — COMPREHENSIVE METABOLIC PANEL
ALT: 14 U/L (ref 0–55)
AST (SGOT): 17 U/L (ref 5–34)
Albumin/Globulin Ratio: 1.4 (ref 0.9–2.2)
Albumin: 4 g/dL (ref 3.5–5.0)
Alkaline Phosphatase: 93 U/L (ref 38–106)
BUN: 17 mg/dL (ref 9.0–28.0)
Bilirubin, Total: 0.4 mg/dL (ref 0.2–1.2)
CO2: 25 mEq/L (ref 21–29)
Calcium: 10 mg/dL (ref 8.5–10.5)
Chloride: 106 mEq/L (ref 100–111)
Creatinine: 1.1 mg/dL (ref 0.5–1.5)
Globulin: 2.9 g/dL (ref 2.0–3.7)
Glucose: 106 mg/dL — ABNORMAL HIGH (ref 70–100)
Potassium: 5.2 mEq/L — ABNORMAL HIGH (ref 3.5–5.1)
Protein, Total: 6.9 g/dL (ref 6.0–8.3)
Sodium: 140 mEq/L (ref 136–145)

## 2017-05-22 LAB — GFR: EGFR: >60

## 2017-05-22 LAB — HEMOLYSIS INDEX: Hemolysis Index: 6 (ref 0–18)

## 2017-05-22 NOTE — Addendum Note (Signed)
Addended by: Raj Janus on: 05/22/2017 01:08 PM     Modules accepted: Orders

## 2017-05-25 ENCOUNTER — Other Ambulatory Visit (FREE_STANDING_LABORATORY_FACILITY): Payer: Self-pay

## 2017-05-25 DIAGNOSIS — E875 Hyperkalemia: Secondary | ICD-10-CM

## 2017-05-25 LAB — BASIC METABOLIC PANEL
BUN: 20 mg/dL (ref 9.0–28.0)
CO2: 25 mEq/L (ref 21–29)
Calcium: 10 mg/dL (ref 8.5–10.5)
Chloride: 103 mEq/L (ref 100–111)
Creatinine: 1.4 mg/dL (ref 0.5–1.5)
Glucose: 102 mg/dL — ABNORMAL HIGH (ref 70–100)
Potassium: 4.9 mEq/L (ref 3.5–5.1)
Sodium: 138 mEq/L (ref 136–145)

## 2017-05-25 LAB — HEMOLYSIS INDEX: Hemolysis Index: 6 (ref 0–18)

## 2017-05-25 LAB — GFR: EGFR: 51.4

## 2017-05-28 ENCOUNTER — Encounter (INDEPENDENT_AMBULATORY_CARE_PROVIDER_SITE_OTHER): Payer: Self-pay

## 2017-05-28 ENCOUNTER — Telehealth (INDEPENDENT_AMBULATORY_CARE_PROVIDER_SITE_OTHER): Payer: Self-pay | Admitting: Family Medicine

## 2017-05-28 NOTE — Progress Notes (Signed)
Please call pt to inform them that their lab data were within acceptable limits.  He is cleared for his surgery.  They may follow-up as discussed, sooner with any problems or concerns.

## 2017-05-28 NOTE — Telephone Encounter (Signed)
Spoke to Dr. Jeanene Erb office and verified fax number. Preop clearance has been faxed. Pt notified.    Phone # [phone]  Fax # [phone]

## 2017-06-13 ENCOUNTER — Encounter (INDEPENDENT_AMBULATORY_CARE_PROVIDER_SITE_OTHER): Payer: Self-pay | Admitting: Family Medicine

## 2017-08-07 ENCOUNTER — Other Ambulatory Visit (INDEPENDENT_AMBULATORY_CARE_PROVIDER_SITE_OTHER): Payer: Self-pay | Admitting: Family Medicine

## 2017-08-07 DIAGNOSIS — I10 Essential (primary) hypertension: Secondary | ICD-10-CM

## 2017-08-07 MED ORDER — HYDRALAZINE HCL 10 MG PO TABS
10.00 mg | ORAL_TABLET | Freq: Three times a day (TID) | ORAL | 0 refills | Status: DC
Start: 2017-08-07 — End: 2017-11-06

## 2017-08-07 NOTE — Telephone Encounter (Signed)
[facility] is requesting a refill for hydrALAZINE (APRESOLINE) 10 MG tablet

## 2017-08-10 ENCOUNTER — Other Ambulatory Visit (INDEPENDENT_AMBULATORY_CARE_PROVIDER_SITE_OTHER): Payer: Self-pay | Admitting: Family Medicine

## 2017-08-10 DIAGNOSIS — I10 Essential (primary) hypertension: Secondary | ICD-10-CM

## 2017-08-10 DIAGNOSIS — R809 Proteinuria, unspecified: Secondary | ICD-10-CM

## 2017-08-15 NOTE — Telephone Encounter (Signed)
Forward to nurse

## 2017-09-10 ENCOUNTER — Other Ambulatory Visit (INDEPENDENT_AMBULATORY_CARE_PROVIDER_SITE_OTHER): Payer: Self-pay | Admitting: Family Medicine

## 2017-09-10 DIAGNOSIS — I10 Essential (primary) hypertension: Secondary | ICD-10-CM

## 2017-09-10 DIAGNOSIS — R809 Proteinuria, unspecified: Secondary | ICD-10-CM

## 2017-09-11 ENCOUNTER — Other Ambulatory Visit (INDEPENDENT_AMBULATORY_CARE_PROVIDER_SITE_OTHER): Payer: Self-pay | Admitting: Family Medicine

## 2017-09-11 DIAGNOSIS — R809 Proteinuria, unspecified: Secondary | ICD-10-CM

## 2017-09-13 ENCOUNTER — Encounter (INDEPENDENT_AMBULATORY_CARE_PROVIDER_SITE_OTHER): Payer: Self-pay

## 2017-09-13 NOTE — Telephone Encounter (Signed)
Pt notified via mychart

## 2017-10-09 ENCOUNTER — Other Ambulatory Visit (INDEPENDENT_AMBULATORY_CARE_PROVIDER_SITE_OTHER): Payer: Self-pay | Admitting: Family Medicine

## 2017-10-09 DIAGNOSIS — R809 Proteinuria, unspecified: Secondary | ICD-10-CM

## 2017-10-09 DIAGNOSIS — I10 Essential (primary) hypertension: Secondary | ICD-10-CM

## 2017-11-06 ENCOUNTER — Other Ambulatory Visit (INDEPENDENT_AMBULATORY_CARE_PROVIDER_SITE_OTHER): Payer: Self-pay | Admitting: Family Medicine

## 2017-11-06 DIAGNOSIS — I10 Essential (primary) hypertension: Secondary | ICD-10-CM

## 2018-02-20 ENCOUNTER — Encounter: Payer: Self-pay | Admitting: Internal Medicine

## 2018-02-20 ENCOUNTER — Other Ambulatory Visit: Payer: Self-pay

## 2018-02-20 ENCOUNTER — Telehealth: Payer: Self-pay

## 2018-02-20 ENCOUNTER — Ambulatory Visit: Payer: Self-pay | Admitting: Internal Medicine

## 2018-02-20 ENCOUNTER — Ambulatory Visit (INDEPENDENT_AMBULATORY_CARE_PROVIDER_SITE_OTHER): Payer: Self-pay | Admitting: Physician Assistant

## 2018-02-20 VITALS — BP 161/71 | HR 73 | Temp 98.2°F | Ht 69.0 in | Wt 225.2 lb

## 2018-02-20 VITALS — BP 155/75 | HR 77 | Temp 98.4°F | Resp 16 | Wt 223.3 lb

## 2018-02-20 DIAGNOSIS — E785 Hyperlipidemia, unspecified: Secondary | ICD-10-CM

## 2018-02-20 DIAGNOSIS — I10 Essential (primary) hypertension: Secondary | ICD-10-CM

## 2018-02-20 DIAGNOSIS — Z79899 Other long term (current) drug therapy: Secondary | ICD-10-CM

## 2018-02-20 DIAGNOSIS — E119 Type 2 diabetes mellitus without complications: Secondary | ICD-10-CM

## 2018-02-20 DIAGNOSIS — Z5321 Procedure and treatment not carried out due to patient leaving prior to being seen by health care provider: Secondary | ICD-10-CM

## 2018-02-20 MED ORDER — LISINOPRIL-HYDROCHLOROTHIAZIDE 20-12.5 MG PO TABS: 2.0000 | ORAL_TABLET | Freq: Every day | ORAL | 0 refills | Status: DC

## 2018-02-20 MED ORDER — HYDRALAZINE HCL 10 MG PO TABS: 10.0000 mg | ORAL_TABLET | Freq: Three times a day (TID) | ORAL | 0 refills | Status: DC

## 2018-02-20 MED ORDER — CARVEDILOL 25 MG PO TABS: 25.0000 mg | ORAL_TABLET | Freq: Two times a day (BID) | ORAL | 0 refills | Status: DC

## 2018-02-20 MED ORDER — SPIRONOLACTONE 25 MG PO TABS: 25.0000 mg | ORAL_TABLET | Freq: Every day | ORAL | 0 refills | Status: DC

## 2018-02-20 MED ORDER — METFORMIN HCL ER (MOD) 1000 MG PO TB24: 1000.0000 mg | ORAL_TABLET | Freq: Every day | ORAL | 0 refills | Status: DC

## 2018-02-20 MED ORDER — TERAZOSIN HCL 5 MG PO CAPS: 5.0000 mg | ORAL_CAPSULE | Freq: Every day | ORAL | 0 refills | Status: DC

## 2018-02-20 MED ORDER — ATORVASTATIN CALCIUM 20 MG PO TABS: 20.0000 mg | ORAL_TABLET | Freq: Every day | ORAL | 0 refills | Status: DC

## 2018-02-20 NOTE — Progress Notes (Addendum)
Patient was sent from [facility] for elevated blood pressure of 185/85 this morning.  Patient has a list of medications previously prescribed by his primary care doctor in [place].  States he has not seen his primary care provider since July 2019.  Medications are listed below.  In terms of blood pressure, he is out of lisinopril-HCTZ 40-25mg , carvedilol 25 mg, and terazosin 5mg .  He is currently taking hydralazine, spironolactone, and clonidine.  He is also out of metformin and atorvastatin.  Patient's BP elevated at 155/75 in office. Contacted supervising physician, Dr. Sabra Heck, to discuss if this case was appropriate for our clinic or if we should refer to [facility] due to comorbidities, extensive medication list, and no evidence of recent lab work related to these chronic medical conditions.   While awaiting response from supervising physician, patient got upset about the wait time stating " I have places to be, why can't you just give you my medication."  After explaining to him that I was waiting for a response from my supervising physician so we could provide him with appropriate optimal treatment plan, he got even more frustrated and left the office before an adequate HPI or a physical exam could be completed.  Allergies as of 02/20/2018      Reactions   Morphine And Related       Medication List       Accurate as of February 20, 2018  1:08 PM. Always use your most recent med list.        aspirin EC 81 MG tablet Take 81 mg by mouth daily.   atorvastatin 20 MG tablet Commonly known as:  LIPITOR Take 20 mg by mouth daily.   carvedilol 25 MG tablet Commonly known as:  COREG Take 25 mg by mouth 2 (two) times daily with a meal.   hydrALAZINE 10 MG tablet Commonly known as:  APRESOLINE Take 10 mg by mouth 3 (three) times daily.   lisinopril-hydrochlorothiazide 20-12.5 MG tablet Commonly known as:  PRINZIDE,ZESTORETIC Take 1 tablet by mouth daily.    metFORMIN 1000 MG (MOD) 24 hr tablet Commonly known as:  GLUMETZA Take 1,000 mg by mouth daily with breakfast.   spironolactone 25 MG tablet Commonly known as:  ALDACTONE Take 25 mg by mouth daily.   terazosin 5 MG capsule Commonly known as:  HYTRIN Take 5 mg by mouth at bedtime.

## 2018-02-20 NOTE — Telephone Encounter (Signed)
I called patient and told him, I spoke with Helene Kelp at [facility], and explained to her, we were no able to refill the patient medications as he needs a lab work out before the mediation refill. I also told the patient he needs to make an appointment at [facility], which we gave him the information.   Patient told me he will be back with a letter of clearance form his PCP.

## 2018-02-20 NOTE — Progress Notes (Signed)
   CC: Hypertension  HPI:  Mr.Jim Sullivan is a 63 y.o. with DM, HLD and HTN presenting for evaluation of his hypertension. For details of today's visit and the status of his chronic medical issues please refer to the assessment and plan.   No past medical history on file.  Review of Systems:   Review of Systems  Respiratory: Negative for cough, shortness of breath and wheezing.   Cardiovascular: Negative for chest pain and leg swelling.  Neurological: Negative for dizziness, sensory change, weakness and headaches.  All other systems reviewed and are negative.    Physical Exam:  Vitals:   02/20/18 1521  BP: (!) 161/71  Pulse: 73  Temp: 98.2 F (36.8 C)  TempSrc: Oral  SpO2: 99%  Weight: 225 lb 3.2 oz (102.2 kg)  Height: 5\' 9"  (1.753 m)    Physical Exam  Constitutional: He is oriented to person, place, and time and well-developed, well-nourished, and in no distress.  Cardiovascular: Normal rate and regular rhythm.  No murmur heard. Pulmonary/Chest: Effort normal and breath sounds normal. No respiratory distress. He has no wheezes.  Abdominal: Soft. Bowel sounds are normal. He exhibits no distension. There is no abdominal tenderness.  Musculoskeletal:        General: No edema.  Neurological: He is alert and oriented to person, place, and time.  Skin: Skin is warm and dry.  Psychiatric: Mood, memory, affect and judgment normal.    Assessment & Plan:   See Encounters Tab for problem based charting.  Patient discussed with Dr. Eppie Gibson.

## 2018-02-20 NOTE — Patient Instructions (Addendum)
Mr. Sayegh,  I have sent in all of your prescriptions. Please follow up with an [employer] [facility] for a lab draw. Please give [place] a call if you need anything else.  Thanks, Dr. Laural Golden

## 2018-02-20 NOTE — Addendum Note (Signed)
Addended by: Tenna Delaine D on: 02/20/2018 01:44 PM   Modules accepted: Orders, SmartSet

## 2018-02-23 LAB — BMP8+ANION GAP
Anion Gap: 16 mmol/L (ref 10.0–18.0)
BUN/Creatinine Ratio: 9 — ABNORMAL LOW (ref 10–24)
BUN: 14 mg/dL (ref 8–27)
CO2: 24 mmol/L (ref 20–29)
Calcium: 9.2 mg/dL (ref 8.6–10.2)
Chloride: 101 mmol/L (ref 96–106)
Creatinine, Ser: 1.49 mg/dL — ABNORMAL HIGH (ref 0.76–1.27)
GFR calc Af Amer: 57 mL/min/{1.73_m2} — ABNORMAL LOW (ref >59)
GFR calc non Af Amer: 50 mL/min/{1.73_m2} — ABNORMAL LOW (ref >59)
Glucose: 198 mg/dL — ABNORMAL HIGH (ref 65–99)
Potassium: 4.1 mmol/L (ref 3.5–5.2)
Sodium: 141 mmol/L (ref 134–144)

## 2018-02-25 DIAGNOSIS — E119 Type 2 diabetes mellitus without complications: Secondary | ICD-10-CM | POA: Insufficient documentation

## 2018-02-25 DIAGNOSIS — I10 Essential (primary) hypertension: Secondary | ICD-10-CM | POA: Insufficient documentation

## 2018-02-25 DIAGNOSIS — E785 Hyperlipidemia, unspecified: Secondary | ICD-10-CM | POA: Insufficient documentation

## 2018-02-25 NOTE — Assessment & Plan Note (Signed)
Continue atorvastatin 20 mg

## 2018-02-25 NOTE — Assessment & Plan Note (Signed)
Patient was seen at his health at work assessment and found to have an elevated BP. He was [facility] however left that appointment before he was seen by a physician. He has a list of medications previously prescribed by his primary care doctor in [place].  States he has not seen his primary care provider since July 2019.  He has been out of  lisinopril-HCTZ 40-25mg , carvedilol 25 mg, and terazosin 5mg .  He is currently taking hydralazine and spironolactone. We will restart him on these medications and check his BMP today. He lives outside of [place] and will be working in [place]. Ultimately he will need to establish care with a PCP near his hometown.     Plan: - check BMP - continue lisinopril-hctz, carvedilol, hydralazine, spironolactone and terazosin - stop clonidine prn

## 2018-02-25 NOTE — Assessment & Plan Note (Signed)
Patient takes metformin 1000 mg qd. Will continue this.

## 2018-02-26 NOTE — Addendum Note (Signed)
Addended by: Oval Linsey D on: 02/26/2018 09:36 AM   Modules accepted: Level of Service

## 2018-02-26 NOTE — Progress Notes (Signed)
Patient ID: Jim Sullivan, male   DOB: 07/03/1955, 63 y.o.   MRN: [id number]  Case discussed with Dr. Laural Golden at the time of the visit. We reviewed the resident's history and exam and pertinent patient test results. I agree with the assessment, diagnosis, and plan of care documented in the resident's note.  Apparently he is interested in establishing primary care closer to his home and place of employment and is not interested in establishing care here.  Agree with restarting his prior antihypertensive medications with follow-up in his new PCP's office.

## 2018-03-19 ENCOUNTER — Other Ambulatory Visit: Payer: Self-pay | Admitting: Internal Medicine

## 2018-03-19 ENCOUNTER — Encounter: Payer: Self-pay | Admitting: Internal Medicine

## 2018-03-19 DIAGNOSIS — E785 Hyperlipidemia, unspecified: Secondary | ICD-10-CM

## 2018-03-19 DIAGNOSIS — I10 Essential (primary) hypertension: Secondary | ICD-10-CM

## 2018-03-19 DIAGNOSIS — E119 Type 2 diabetes mellitus without complications: Secondary | ICD-10-CM

## 2018-03-21 ENCOUNTER — Other Ambulatory Visit: Payer: Self-pay | Admitting: Internal Medicine

## 2018-03-21 DIAGNOSIS — I10 Essential (primary) hypertension: Secondary | ICD-10-CM

## 2018-03-21 DIAGNOSIS — E785 Hyperlipidemia, unspecified: Secondary | ICD-10-CM

## 2018-03-21 MED ORDER — TERAZOSIN HCL 5 MG PO CAPS: 5.0000 mg | ORAL_CAPSULE | Freq: Every day | ORAL | 0 refills | Status: DC

## 2018-03-21 MED ORDER — LISINOPRIL-HYDROCHLOROTHIAZIDE 20-12.5 MG PO TABS: 2.0000 | ORAL_TABLET | Freq: Every day | ORAL | 0 refills | Status: DC

## 2018-03-21 MED ORDER — HYDRALAZINE HCL 10 MG PO TABS: 10.0000 mg | ORAL_TABLET | Freq: Three times a day (TID) | ORAL | 0 refills | Status: DC

## 2018-03-21 MED ORDER — METFORMIN HCL ER (MOD) 1000 MG PO TB24: 1000.0000 mg | ORAL_TABLET | Freq: Every day | ORAL | 0 refills | Status: DC

## 2018-03-21 MED ORDER — SPIRONOLACTONE 25 MG PO TABS: 25.0000 mg | ORAL_TABLET | Freq: Every day | ORAL | 0 refills | Status: DC

## 2018-03-21 MED ORDER — CARVEDILOL 25 MG PO TABS: 25.0000 mg | ORAL_TABLET | Freq: Two times a day (BID) | ORAL | 0 refills | Status: DC

## 2018-03-22 ENCOUNTER — Other Ambulatory Visit: Payer: Self-pay | Admitting: *Deleted

## 2018-03-22 DIAGNOSIS — E785 Hyperlipidemia, unspecified: Secondary | ICD-10-CM

## 2018-03-22 MED ORDER — ATORVASTATIN CALCIUM 20 MG PO TABS: 20.0000 mg | ORAL_TABLET | Freq: Every day | ORAL | 0 refills | Status: DC

## 2018-04-11 ENCOUNTER — Encounter (INDEPENDENT_AMBULATORY_CARE_PROVIDER_SITE_OTHER): Payer: Self-pay | Admitting: Family Medicine

## 2018-04-12 ENCOUNTER — Other Ambulatory Visit: Payer: Self-pay | Admitting: *Deleted

## 2018-04-12 DIAGNOSIS — E119 Type 2 diabetes mellitus without complications: Secondary | ICD-10-CM

## 2018-04-12 DIAGNOSIS — E785 Hyperlipidemia, unspecified: Secondary | ICD-10-CM

## 2018-04-12 DIAGNOSIS — I10 Essential (primary) hypertension: Secondary | ICD-10-CM

## 2018-04-12 MED ORDER — SPIRONOLACTONE 25 MG PO TABS: 25.0000 mg | ORAL_TABLET | Freq: Every day | ORAL | 1 refills | Status: DC

## 2018-04-12 MED ORDER — TERAZOSIN HCL 5 MG PO CAPS: 5.0000 mg | ORAL_CAPSULE | Freq: Every day | ORAL | 1 refills | Status: DC

## 2018-04-12 MED ORDER — ATORVASTATIN CALCIUM 20 MG PO TABS: 20.0000 mg | ORAL_TABLET | Freq: Every day | ORAL | 1 refills | Status: DC

## 2018-04-12 MED ORDER — LISINOPRIL-HYDROCHLOROTHIAZIDE 20-12.5 MG PO TABS: 2.0000 | ORAL_TABLET | Freq: Every day | ORAL | 1 refills | Status: DC

## 2018-04-12 MED ORDER — CARVEDILOL 25 MG PO TABS: 25.0000 mg | ORAL_TABLET | Freq: Two times a day (BID) | ORAL | 1 refills | Status: DC

## 2018-04-12 MED ORDER — METFORMIN HCL ER (MOD) 1000 MG PO TB24: 1000.0000 mg | ORAL_TABLET | Freq: Every day | ORAL | 1 refills | Status: DC

## 2018-04-12 MED ORDER — HYDRALAZINE HCL 10 MG PO TABS: 10.0000 mg | ORAL_TABLET | Freq: Three times a day (TID) | ORAL | 1 refills | Status: DC

## 2018-04-12 MED FILL — CARVEDILOL 25 MG TABLET: 25 | 30 days supply | Qty: 60 | Fill #0

## 2018-04-12 MED FILL — SPIRONOLACTONE 25 MG TABS: 25 | 30 days supply | Qty: 30 | Fill #0

## 2018-04-12 MED FILL — ATORVASTATIN 20 MG TABLET: 20 | 30 days supply | Qty: 30 | Fill #0

## 2018-04-12 MED FILL — TERAZOSIN 5 MG CAPSULE: 5 | 30 days supply | Qty: 30 | Fill #0

## 2018-04-12 MED FILL — metFORMIN HCL ER 500 MG TB2: 500 | 30 days supply | Qty: 60 | Fill #0

## 2018-04-12 MED FILL — LISINOPRIL-HCTZ 20-12.5 MG: 20-12.5 | 30 days supply | Qty: 60 | Fill #0

## 2018-04-12 MED FILL — hydrALAZINE HCL 10 MG TABS: 10 | 30 days supply | Qty: 90 | Fill #0

## 2018-04-12 NOTE — Progress Notes (Signed)
This is a new pt to [place] but Dr Livia Snellen was able to see previous visit with other [facility] and ok'd refills on pt's rx so we could reschedule pt due to COVID-19 to reduce in office pt's and exposure.

## 2018-04-16 ENCOUNTER — Ambulatory Visit: Payer: Self-pay | Admitting: Family Medicine

## 2018-05-07 ENCOUNTER — Ambulatory Visit: Payer: Self-pay | Admitting: Family Medicine

## 2018-05-09 ENCOUNTER — Other Ambulatory Visit: Payer: Self-pay

## 2018-05-10 ENCOUNTER — Ambulatory Visit (INDEPENDENT_AMBULATORY_CARE_PROVIDER_SITE_OTHER): Payer: 59 | Admitting: Family Medicine

## 2018-05-10 ENCOUNTER — Encounter: Payer: Self-pay | Admitting: Family Medicine

## 2018-05-10 VITALS — BP 169/73 | HR 74 | Ht 69.0 in | Wt 213.0 lb

## 2018-05-10 DIAGNOSIS — I10 Essential (primary) hypertension: Secondary | ICD-10-CM

## 2018-05-10 DIAGNOSIS — E785 Hyperlipidemia, unspecified: Secondary | ICD-10-CM

## 2018-05-10 DIAGNOSIS — E119 Type 2 diabetes mellitus without complications: Secondary | ICD-10-CM

## 2018-05-10 DIAGNOSIS — E559 Vitamin D deficiency, unspecified: Secondary | ICD-10-CM

## 2018-05-10 DIAGNOSIS — Z125 Encounter for screening for malignant neoplasm of prostate: Secondary | ICD-10-CM | POA: Diagnosis not present

## 2018-05-10 NOTE — Progress Notes (Signed)
Subjective:  Patient ID: Jim Sullivan, male    DOB: Jul 27, 1955  Age: 63 y.o. MRN: [id number]  CC: New Patient (Initial Visit)   HPI Jim Sullivan presents for follow-up of diabetes. Patient checks blood sugar at home.  His fasting numbers tend to run between 100 and 120.  Diagnosis was approximately 25 years ago.  He took metformin until 2004.  He lost almost 100 pounds from approximately 300 to just under 200 pounds.  At that time he discontinued medication.  He has recently gained back some of the weight and has started back on metformin.  See med list below.  Patient denies symptoms such as polyuria, polydipsia, excessive hunger, nausea No significant hypoglycemic spells noted. Medications as noted below. Taking them regularly without complication/adverse reaction being reported today.   Follow-up of hypertension. Patient has no history of headache chest pain or shortness of breath or recent cough. Patient also denies symptoms of TIA such as numbness weakness lateralizing. Patient checks  blood pressure at home and has not had any elevated readings recently. Patient denies side effects from his medication. States taking them regularly.  His blood pressure has been hard to control and required the use of multiple medications to bring it under goal.  Patient in for follow-up of elevated cholesterol. Doing well without complaints on current medication. Denies side effects of statin including myalgia and arthralgia and nausea. Also in today for liver function testing. Currently no chest pain, shortness of breath or other cardiovascular related symptoms noted. Depression screen [facility] 2/9 02/20/2018  Decreased Interest 0  Down, Depressed, Hopeless 0  PHQ - 2 Score 0  Altered sleeping 1  Tired, decreased energy 1  Change in appetite 0  Feeling bad or failure about yourself  0  Trouble concentrating 0  Moving slowly or fidgety/restless 0  Suicidal thoughts 0  PHQ-9 Score 2  Difficult doing  work/chores Not difficult at all    History Jim Sullivan has a past medical history of Diabetes mellitus without complication ([place]), Erectile dysfunction, GERD (gastroesophageal reflux disease), Hyperlipidemia, and Hypertension.   He has a past surgical history that includes Hernia repair (Right, 2019); Hernia repair (Left, infancy); and Small intestine surgery (2013).   His family history is not on file.He reports that he quit smoking about 5 years ago. His smoking use included cigarettes. He has a 15.00 pack-year smoking history. He has never used smokeless tobacco. No history on file for alcohol and drug.    ROS Review of Systems  Constitutional: Negative for activity change, fatigue and unexpected weight change.  HENT: Negative for congestion, ear pain, hearing loss, postnasal drip and trouble swallowing.   Eyes: Negative for pain and visual disturbance.  Respiratory: Negative for cough, chest tightness and shortness of breath.   Cardiovascular: Negative for chest pain, palpitations and leg swelling.  Gastrointestinal: Negative for abdominal distention, abdominal pain, blood in stool, constipation, diarrhea, nausea and vomiting.  Endocrine: Negative for cold intolerance, heat intolerance and polydipsia.  Genitourinary: Negative for difficulty urinating, dysuria, flank pain, frequency and urgency.  Musculoskeletal: Negative for arthralgias (bone on bone at loeft hip. Responds to occasional Ibuprofen) and joint swelling.  Skin: Negative for color change, rash and wound.  Neurological: Negative for dizziness, syncope, speech difficulty, weakness, light-headedness, numbness and headaches.  Hematological: Does not bruise/bleed easily.  Psychiatric/Behavioral: Negative for confusion, decreased concentration, dysphoric mood and sleep disturbance. The patient is not nervous/anxious.     Objective:  BP (!) 169/73   Pulse 74  Ht _0  (1.753 m)   Wt 213 lb (96.6 kg)   BMI 31.45 kg/m   BP  Readings from Last 3 Encounters:  05/10/18 (!) 169/73  02/20/18 (!) 161/71  02/20/18 (!) 155/75    Wt Readings from Last 3 Encounters:  05/10/18 213 lb (96.6 kg)  02/20/18 225 lb 3.2 oz (102.2 kg)  02/20/18 223 lb 4.8 oz (101.3 kg)     Physical Exam Constitutional:      Appearance: He is well-developed.  HENT:     Head: Normocephalic and atraumatic.  Eyes:     Pupils: Pupils are equal, round, and reactive to light.  Neck:     Musculoskeletal: Normal range of motion.     Thyroid: No thyromegaly.     Trachea: No tracheal deviation.  Cardiovascular:     Rate and Rhythm: Normal rate and regular rhythm.     Heart sounds: Normal heart sounds. No murmur. No friction rub. No gallop.   Pulmonary:     Breath sounds: Normal breath sounds. No wheezing or rales.  Abdominal:     General: Bowel sounds are normal. There is no distension.     Palpations: Abdomen is soft. There is no mass.     Tenderness: There is no abdominal tenderness.     Hernia: There is no hernia in the right inguinal area or left inguinal area.  Musculoskeletal: Normal range of motion.  Lymphadenopathy:     Cervical: No cervical adenopathy.  Skin:    General: Skin is warm and dry.  Neurological:     Mental Status: He is alert and oriented to person, place, and time.       Assessment & Plan:   Jim Sullivan was seen today for new patient (initial visit).  Diagnoses and all orders for this visit:  Essential hypertension -     CBC with Differential/Platelet -     CMP14+EGFR -     Lipid panel  Type 2 diabetes mellitus without complication, without long-term current use of insulin (HCC) -     CBC with Differential/Platelet -     CMP14+EGFR -     Lipid panel -     Bayer DCA Hb A1c Waived  Hyperlipidemia, unspecified hyperlipidemia type -     CBC with Differential/Platelet -     CMP14+EGFR -     Lipid panel  Screening for prostate cancer -     CBC with Differential/Platelet -     CMP14+EGFR -     Lipid  panel -     PSA Total (Reflex To Free)  Vitamin D deficiency -     CBC with Differential/Platelet -     CMP14+EGFR -     Lipid panel -     VITAMIN D 25 Hydroxy (Vit-D Deficiency, Fractures)  Other orders -     metFORMIN (GLUCOPHAGE-XR) 500 MG 24 hr tablet; Take 2 tablets (1,000 mg total) by mouth daily with breakfast.       I have discontinued Jim Sullivan's metFORMIN. I am also having him start on metFORMIN. Additionally, I am having him maintain his aspirin EC, carvedilol, hydrALAZINE, lisinopril-hydrochlorothiazide, spironolactone, terazosin, and atorvastatin.  Allergies as of 05/10/2018      Reactions   Morphine And Related       Medication List       Accurate as of May 10, 2018 11:59 PM. Always use your most recent med list.        aspirin EC 81 MG tablet Take 81  mg by mouth daily.   atorvastatin 20 MG tablet Commonly known as:  LIPITOR Take 1 tablet (20 mg total) by mouth daily.   carvedilol 25 MG tablet Commonly known as:  COREG Take 1 tablet (25 mg total) by mouth 2 (two) times daily with a meal.   hydrALAZINE 10 MG tablet Commonly known as:  APRESOLINE Take 1 tablet (10 mg total) by mouth 3 (three) times daily.   lisinopril-hydrochlorothiazide 20-12.5 MG tablet Commonly known as:  ZESTORETIC Take 2 tablets by mouth daily.   metFORMIN 500 MG 24 hr tablet Commonly known as:  GLUCOPHAGE-XR Take 2 tablets (1,000 mg total) by mouth daily with breakfast.   spironolactone 25 MG tablet Commonly known as:  ALDACTONE Take 1 tablet (25 mg total) by mouth daily.   terazosin 5 MG capsule Commonly known as:  HYTRIN Take 1 capsule (5 mg total) by mouth at bedtime.        Follow-up: Return in about 3 months (around 08/09/2018).  Claretta Fraise, M.D.

## 2018-05-13 ENCOUNTER — Other Ambulatory Visit: Payer: Self-pay

## 2018-05-14 ENCOUNTER — Encounter: Payer: Self-pay | Admitting: Family Medicine

## 2018-05-14 ENCOUNTER — Other Ambulatory Visit: Payer: 59

## 2018-05-14 ENCOUNTER — Other Ambulatory Visit: Payer: Self-pay

## 2018-05-14 DIAGNOSIS — E559 Vitamin D deficiency, unspecified: Secondary | ICD-10-CM | POA: Diagnosis not present

## 2018-05-14 DIAGNOSIS — E119 Type 2 diabetes mellitus without complications: Secondary | ICD-10-CM | POA: Diagnosis not present

## 2018-05-14 DIAGNOSIS — Z125 Encounter for screening for malignant neoplasm of prostate: Secondary | ICD-10-CM | POA: Diagnosis not present

## 2018-05-14 DIAGNOSIS — E785 Hyperlipidemia, unspecified: Secondary | ICD-10-CM | POA: Diagnosis not present

## 2018-05-14 DIAGNOSIS — I10 Essential (primary) hypertension: Secondary | ICD-10-CM | POA: Diagnosis not present

## 2018-05-14 LAB — BAYER DCA HB A1C WAIVED: HB A1C (BAYER DCA - WAIVED): 6.9 % (ref <7.0)

## 2018-05-14 MED ORDER — METFORMIN HCL ER 500 MG PO TB24: 1000.0000 mg | ORAL_TABLET | Freq: Every day | ORAL | 1 refills | Status: DC

## 2018-05-15 ENCOUNTER — Other Ambulatory Visit: Payer: Self-pay | Admitting: *Deleted

## 2018-05-15 LAB — CBC WITH DIFFERENTIAL/PLATELET
Basophils Absolute: 0.1 10*3/uL (ref 0.0–0.2)
Basos: 1 %
EOS (ABSOLUTE): 0.3 10*3/uL (ref 0.0–0.4)
Eos: 4 %
Hematocrit: 35.1 % — ABNORMAL LOW (ref 37.5–51.0)
Hemoglobin: 11.5 g/dL — ABNORMAL LOW (ref 13.0–17.7)
Immature Grans (Abs): 0 10*3/uL (ref 0.0–0.1)
Immature Granulocytes: 0 %
Lymphocytes Absolute: 1.8 10*3/uL (ref 0.7–3.1)
Lymphs: 22 %
MCH: 29.2 pg (ref 26.6–33.0)
MCHC: 32.8 g/dL (ref 31.5–35.7)
MCV: 89 fL (ref 79–97)
Monocytes Absolute: 0.6 10*3/uL (ref 0.1–0.9)
Monocytes: 8 %
Neutrophils Absolute: 5.4 10*3/uL (ref 1.4–7.0)
Neutrophils: 65 %
Platelets: 441 10*3/uL (ref 150–450)
RBC: 3.94 x10E6/uL — ABNORMAL LOW (ref 4.14–5.80)
RDW: 13.8 % (ref 11.6–15.4)
WBC: 8.2 10*3/uL (ref 3.4–10.8)

## 2018-05-15 LAB — CMP14+EGFR
ALT: 11 IU/L (ref 0–44)
AST: 18 IU/L (ref 0–40)
Albumin/Globulin Ratio: 1.4 (ref 1.2–2.2)
Albumin: 4.1 g/dL (ref 3.8–4.8)
Alkaline Phosphatase: 120 IU/L — ABNORMAL HIGH (ref 39–117)
BUN/Creatinine Ratio: 12 (ref 10–24)
BUN: 20 mg/dL (ref 8–27)
Bilirubin Total: 0.2 mg/dL (ref 0.0–1.2)
CO2: 22 mmol/L (ref 20–29)
Calcium: 9.5 mg/dL (ref 8.6–10.2)
Chloride: 107 mmol/L — ABNORMAL HIGH (ref 96–106)
Creatinine, Ser: 1.61 mg/dL — ABNORMAL HIGH (ref 0.76–1.27)
GFR calc Af Amer: 52 mL/min/{1.73_m2} — ABNORMAL LOW (ref >59)
GFR calc non Af Amer: 45 mL/min/{1.73_m2} — ABNORMAL LOW (ref >59)
Globulin, Total: 2.9 g/dL (ref 1.5–4.5)
Glucose: 163 mg/dL — ABNORMAL HIGH (ref 65–99)
Potassium: 5.3 mmol/L — ABNORMAL HIGH (ref 3.5–5.2)
Sodium: 142 mmol/L (ref 134–144)
Total Protein: 7 g/dL (ref 6.0–8.5)

## 2018-05-15 LAB — VITAMIN D 25 HYDROXY (VIT D DEFICIENCY, FRACTURES): Vit D, 25-Hydroxy: 20.8 ng/mL — ABNORMAL LOW (ref 30.0–100.0)

## 2018-05-15 LAB — LIPID PANEL
Chol/HDL Ratio: 4.2 ratio (ref 0.0–5.0)
Cholesterol, Total: 151 mg/dL (ref 100–199)
HDL: 36 mg/dL — ABNORMAL LOW (ref >39)
LDL Calculated: 96 mg/dL (ref 0–99)
Triglycerides: 95 mg/dL (ref 0–149)
VLDL Cholesterol Cal: 19 mg/dL (ref 5–40)

## 2018-05-15 LAB — PSA TOTAL (REFLEX TO FREE): Prostate Specific Ag, Serum: 0.9 ng/mL (ref 0.0–4.0)

## 2018-05-15 MED ORDER — VITAMIN D (ERGOCALCIFEROL) 1.25 MG (50000 UNIT) PO CAPS: 50000.0000 [IU] | ORAL_CAPSULE | ORAL | 1 refills | Status: DC

## 2018-05-17 ENCOUNTER — Telehealth: Payer: Self-pay | Admitting: Family Medicine

## 2018-05-20 ENCOUNTER — Other Ambulatory Visit: Payer: Self-pay | Admitting: *Deleted

## 2018-05-20 DIAGNOSIS — E785 Hyperlipidemia, unspecified: Secondary | ICD-10-CM

## 2018-05-20 DIAGNOSIS — I10 Essential (primary) hypertension: Secondary | ICD-10-CM

## 2018-05-20 MED ORDER — VITAMIN D (ERGOCALCIFEROL) 1.25 MG (50000 UNIT) PO CAPS: 50000.0000 [IU] | ORAL_CAPSULE | ORAL | 1 refills | Status: DC

## 2018-05-20 MED ORDER — SPIRONOLACTONE 25 MG PO TABS: 25.0000 mg | ORAL_TABLET | Freq: Every day | ORAL | 1 refills | Status: DC

## 2018-05-20 MED ORDER — ATORVASTATIN CALCIUM 20 MG PO TABS: 20.0000 mg | ORAL_TABLET | Freq: Every day | ORAL | 1 refills | Status: DC

## 2018-05-20 MED ORDER — HYDRALAZINE HCL 10 MG PO TABS: 10.0000 mg | ORAL_TABLET | Freq: Three times a day (TID) | ORAL | 1 refills | Status: DC

## 2018-05-20 MED ORDER — LISINOPRIL-HYDROCHLOROTHIAZIDE 20-12.5 MG PO TABS: 2.0000 | ORAL_TABLET | Freq: Every day | ORAL | 1 refills | Status: DC

## 2018-05-20 MED ORDER — METFORMIN HCL ER 500 MG PO TB24: 1000.0000 mg | ORAL_TABLET | Freq: Every day | ORAL | 1 refills | Status: DC

## 2018-05-20 MED ORDER — TERAZOSIN HCL 5 MG PO CAPS: 5.0000 mg | ORAL_CAPSULE | Freq: Every day | ORAL | 1 refills | Status: DC

## 2018-05-20 MED ORDER — CARVEDILOL 25 MG PO TABS: 25.0000 mg | ORAL_TABLET | Freq: Two times a day (BID) | ORAL | 1 refills | Status: DC

## 2018-05-20 MED FILL — LISINOPRIL-HCTZ 20-12.5 MG: 20-12.5 | 90 days supply | Qty: 180 | Fill #0

## 2018-05-20 MED FILL — CARVEDILOL 25 MG TABLET: 25 | 90 days supply | Qty: 180 | Fill #0

## 2018-05-20 MED FILL — hydrALAZINE HCL 10 MG TABS: 10 | 90 days supply | Qty: 270 | Fill #0

## 2018-05-20 MED FILL — VIT D2 1.25 MG (50,000 UNIT: 1.25 MG | 84 days supply | Qty: 12 | Fill #0

## 2018-05-20 MED FILL — TERAZOSIN 5 MG CAPSULE: 5 | 90 days supply | Qty: 90 | Fill #0

## 2018-05-20 MED FILL — ATORVASTATIN 20 MG TABLET: 20 | 90 days supply | Qty: 90 | Fill #0

## 2018-05-20 MED FILL — metFORMIN HCL ER 500 MG TB2: 500 | 90 days supply | Qty: 180 | Fill #0

## 2018-05-20 MED FILL — SPIRONOLACTONE 25 MG TABS: 25 | 90 days supply | Qty: 90 | Fill #0

## 2018-05-20 NOTE — Telephone Encounter (Signed)
Refills sent to [facility]

## 2018-08-20 ENCOUNTER — Other Ambulatory Visit: Payer: Self-pay

## 2018-08-21 ENCOUNTER — Ambulatory Visit (INDEPENDENT_AMBULATORY_CARE_PROVIDER_SITE_OTHER): Payer: 59 | Admitting: Family Medicine

## 2018-08-21 ENCOUNTER — Encounter: Payer: Self-pay | Admitting: Family Medicine

## 2018-08-21 VITALS — BP 130/62 | HR 64 | Temp 98.0°F | Ht 69.0 in | Wt 217.0 lb

## 2018-08-21 DIAGNOSIS — I10 Essential (primary) hypertension: Secondary | ICD-10-CM

## 2018-08-21 DIAGNOSIS — E785 Hyperlipidemia, unspecified: Secondary | ICD-10-CM | POA: Diagnosis not present

## 2018-08-21 DIAGNOSIS — E119 Type 2 diabetes mellitus without complications: Secondary | ICD-10-CM | POA: Diagnosis not present

## 2018-08-21 DIAGNOSIS — Z114 Encounter for screening for human immunodeficiency virus [HIV]: Secondary | ICD-10-CM

## 2018-08-21 DIAGNOSIS — Z1159 Encounter for screening for other viral diseases: Secondary | ICD-10-CM | POA: Diagnosis not present

## 2018-08-21 LAB — BAYER DCA HB A1C WAIVED: HB A1C (BAYER DCA - WAIVED): 6.7 % (ref <7.0)

## 2018-08-21 MED ORDER — HYDRALAZINE HCL 10 MG PO TABS: 10.0000 mg | ORAL_TABLET | Freq: Three times a day (TID) | ORAL | 1 refills | Status: DC

## 2018-08-21 MED ORDER — TERAZOSIN HCL 5 MG PO CAPS: 5.0000 mg | ORAL_CAPSULE | Freq: Every day | ORAL | 1 refills | Status: DC

## 2018-08-21 MED ORDER — METFORMIN HCL ER 500 MG PO TB24: 1000.0000 mg | ORAL_TABLET | Freq: Every day | ORAL | 1 refills | Status: DC

## 2018-08-21 MED ORDER — LISINOPRIL-HYDROCHLOROTHIAZIDE 20-12.5 MG PO TABS: 2.0000 | ORAL_TABLET | Freq: Every day | ORAL | 1 refills | Status: DC

## 2018-08-21 MED ORDER — CARVEDILOL 25 MG PO TABS: 25.0000 mg | ORAL_TABLET | Freq: Two times a day (BID) | ORAL | 1 refills | Status: DC

## 2018-08-21 MED ORDER — SPIRONOLACTONE 25 MG PO TABS: 25.0000 mg | ORAL_TABLET | Freq: Every day | ORAL | 1 refills | Status: DC

## 2018-08-21 MED ORDER — ATORVASTATIN CALCIUM 20 MG PO TABS: 20.0000 mg | ORAL_TABLET | Freq: Every day | ORAL | 1 refills | Status: DC

## 2018-08-21 MED ORDER — VITAMIN D (ERGOCALCIFEROL) 1.25 MG (50000 UNIT) PO CAPS: 50000.0000 [IU] | ORAL_CAPSULE | ORAL | 1 refills | Status: DC

## 2018-08-21 MED ORDER — BLOOD GLUCOSE METER KIT: PACK | 0 refills | Status: DC

## 2018-08-21 MED FILL — LISINOPRIL-HCTZ 20-12.5 MG: 20-12.5 | 90 days supply | Qty: 180 | Fill #0

## 2018-08-21 MED FILL — VIT D2 1.25 MG (50,000 UNIT: 1.25 MG | 84 days supply | Qty: 12 | Fill #0

## 2018-08-21 MED FILL — ATORVASTATIN 20 MG TABLET: 20 | 90 days supply | Qty: 90 | Fill #0

## 2018-08-21 MED FILL — CARVEDILOL 25 MG TABLET: 25 | 90 days supply | Qty: 180 | Fill #0

## 2018-08-21 MED FILL — hydrALAZINE HCL 10 MG TABS: 10 | 90 days supply | Qty: 270 | Fill #0

## 2018-08-21 MED FILL — METFORMIN HCL ER 500 MG TB2: 500 | 30 days supply | Qty: 60 | Fill #0

## 2018-08-21 MED FILL — TERAZOSIN 5 MG CAPSULE: 5 | 90 days supply | Qty: 90 | Fill #0

## 2018-08-21 MED FILL — SPIRONOLACTONE 25 MG TABS: 25 | 90 days supply | Qty: 90 | Fill #0

## 2018-08-21 NOTE — Progress Notes (Addendum)
Subjective:  Patient ID: Jim Sullivan,  male    DOB: 23-Dec-1955  Age: 63 y.o.    CC: Medical Management of Chronic Issues   HPI Jim Sullivan presents for  follow-up of hypertension. Patient has no history of headache chest pain or shortness of breath or recent cough. Patient also denies symptoms of TIA such as numbness weakness lateralizing. Patient denies side effects from medication. States taking it regularly.  Patient also  in for follow-up of elevated cholesterol. Doing well without complaints on current medication. Denies side effects  including myalgia and arthralgia and nausea. Also in today for liver function testing. Currently no chest pain, shortness of breath or other cardiovascular related symptoms noted.  Follow-up of diabetes. Patient does occasionally check blood sugar at home. Readings run between 130 and 150  random Patient denies symptoms such as excessive hunger or urinary frequency, excessive hunger, nausea No significant hypoglycemic spells noted. Medications reviewed. Pt reports taking them regularly. Pt. denies complication/adverse reaction today.    History Jim Sullivan has a past medical history of Diabetes mellitus without complication ([place]), Erectile dysfunction, GERD (gastroesophageal reflux disease), Hyperlipidemia, and Hypertension.   He has a past surgical history that includes Hernia repair (Right, 2019); Hernia repair (Left, infancy); and Small intestine surgery (2013).   His family history is not on file.He reports that he quit smoking about 5 years ago. His smoking use included cigarettes. He has a 15.00 pack-year smoking history. He has never used smokeless tobacco. No history on file for alcohol and drug.  Current Outpatient Medications on File Prior to Visit  Medication Sig Dispense Refill   aspirin EC 81 MG tablet Take 81 mg by mouth daily.     No current facility-administered medications on file prior to visit.     ROS Review of Systems    Constitutional: Negative.   HENT: Negative.   Eyes: Negative for visual disturbance.  Respiratory: Negative for cough and shortness of breath.   Cardiovascular: Negative for chest pain and leg swelling.  Gastrointestinal: Negative for abdominal pain, diarrhea, nausea and vomiting.  Genitourinary: Negative for difficulty urinating.  Musculoskeletal: Negative for arthralgias and myalgias.  Skin: Negative for rash.  Neurological: Negative for headaches.  Psychiatric/Behavioral: Negative for sleep disturbance.    Objective:  BP 130/62    Pulse 64    Temp 98 F (36.7 C) (Oral)    Ht _0  (1.753 m)    Wt 217 lb (98.4 kg)    BMI 32.05 kg/m   BP Readings from Last 3 Encounters:  08/21/18 130/62  05/10/18 (!) 169/73  02/20/18 (!) 161/71    Wt Readings from Last 3 Encounters:  08/21/18 217 lb (98.4 kg)  05/10/18 213 lb (96.6 kg)  02/20/18 225 lb 3.2 oz (102.2 kg)     Physical Exam Constitutional:      General: He is not in acute distress.    Appearance: He is well-developed.  HENT:     Head: Normocephalic and atraumatic.     Right Ear: External ear normal.     Left Ear: External ear normal.     Nose: Nose normal.  Eyes:     Conjunctiva/sclera: Conjunctivae normal.     Pupils: Pupils are equal, round, and reactive to light.  Neck:     Musculoskeletal: Normal range of motion and neck supple.  Cardiovascular:     Rate and Rhythm: Normal rate and regular rhythm.     Heart sounds: Normal heart sounds. No murmur.  Pulmonary:  Effort: Pulmonary effort is normal. No respiratory distress.     Breath sounds: Normal breath sounds. No wheezing or rales.  Abdominal:     Palpations: Abdomen is soft.     Tenderness: There is no abdominal tenderness.  Musculoskeletal: Normal range of motion.  Skin:    General: Skin is warm and dry.  Neurological:     Mental Status: He is alert and oriented to person, place, and time.     Deep Tendon Reflexes: Reflexes are normal and symmetric.   Psychiatric:        Behavior: Behavior normal.        Thought Content: Thought content normal.        Judgment: Judgment normal.     Diabetic Foot Exam - Simple   No data filed        Assessment & Plan:   Jim Sullivan was seen today for medical management of chronic issues.  Diagnoses and all orders for this visit:  Essential hypertension -     CBC with Differential/Platelet -     hydrALAZINE (APRESOLINE) 10 MG tablet; Take 1 tablet (10 mg total) by mouth 3 (three) times daily. -     Discontinue: lisinopril-hydrochlorothiazide (ZESTORETIC) 20-12.5 MG tablet; Take 2 tablets by mouth daily. -     carvedilol (COREG) 25 MG tablet; Take 1 tablet (25 mg total) by mouth 2 (two) times daily with a meal. -     spironolactone (ALDACTONE) 25 MG tablet; Take 1 tablet (25 mg total) by mouth daily. -     terazosin (HYTRIN) 5 MG capsule; Take 1 capsule (5 mg total) by mouth at bedtime.  Type 2 diabetes mellitus without complication, without long-term current use of insulin (HCC) -     CMP14+EGFR -     Bayer DCA Hb A1c Waived  Hyperlipidemia, unspecified hyperlipidemia type -     CMP14+EGFR -     atorvastatin (LIPITOR) 20 MG tablet; Take 1 tablet (20 mg total) by mouth daily.  Need for hepatitis C screening test -     Hepatitis C antibody  Encounter for screening for HIV -     HIV Antibody (routine testing w rflx)  Other orders -     metFORMIN (GLUCOPHAGE-XR) 500 MG 24 hr tablet; Take 2 tablets (1,000 mg total) by mouth daily with breakfast. -     Vitamin D, Ergocalciferol, (DRISDOL) 1.25 MG (50000 UT) CAPS capsule; Take 1 capsule (50,000 Units total) by mouth every 7 (seven) days. -     blood glucose meter kit and supplies; Dispense based on patient and insurance preference. Use up to four times daily as directed. (FOR ICD-10 E10.9, E11.9).   I have discontinued Scott Balcerzak's lisinopril-hydrochlorothiazide. I am also having him start on blood glucose meter kit and supplies.  Additionally, I am having him maintain his aspirin EC, metFORMIN, hydrALAZINE, atorvastatin, carvedilol, spironolactone, terazosin, and Vitamin D (Ergocalciferol).  Meds ordered this encounter  Medications   metFORMIN (GLUCOPHAGE-XR) 500 MG 24 hr tablet    Sig: Take 2 tablets (1,000 mg total) by mouth daily with breakfast.    Dispense:  180 tablet    Refill:  1   hydrALAZINE (APRESOLINE) 10 MG tablet    Sig: Take 1 tablet (10 mg total) by mouth 3 (three) times daily.    Dispense:  270 tablet    Refill:  1   DISCONTD: lisinopril-hydrochlorothiazide (ZESTORETIC) 20-12.5 MG tablet    Sig: Take 2 tablets by mouth daily.    Dispense:  180 tablet    Refill:  1   atorvastatin (LIPITOR) 20 MG tablet    Sig: Take 1 tablet (20 mg total) by mouth daily.    Dispense:  90 tablet    Refill:  1   carvedilol (COREG) 25 MG tablet    Sig: Take 1 tablet (25 mg total) by mouth 2 (two) times daily with a meal.    Dispense:  180 tablet    Refill:  1   spironolactone (ALDACTONE) 25 MG tablet    Sig: Take 1 tablet (25 mg total) by mouth daily.    Dispense:  90 tablet    Refill:  1   terazosin (HYTRIN) 5 MG capsule    Sig: Take 1 capsule (5 mg total) by mouth at bedtime.    Dispense:  90 capsule    Refill:  1   Vitamin D, Ergocalciferol, (DRISDOL) 1.25 MG (50000 UT) CAPS capsule    Sig: Take 1 capsule (50,000 Units total) by mouth every 7 (seven) days.    Dispense:  12 capsule    Refill:  1   blood glucose meter kit and supplies    Sig: Dispense based on patient and insurance preference. Use up to four times daily as directed. (FOR ICD-10 E10.9, E11.9).    Dispense:  1 each    Refill:  0    Order Specific Question:   Number of strips    Answer:   100    Order Specific Question:   Number of lancets    Answer:   100     Follow-up: Return in about 3 months (around 11/21/2018) for diabetes.  Claretta Fraise, M.D.

## 2018-08-22 ENCOUNTER — Other Ambulatory Visit: Payer: Self-pay | Admitting: Family Medicine

## 2018-08-22 LAB — CMP14+EGFR
ALT: 13 IU/L (ref 0–44)
AST: 21 IU/L (ref 0–40)
Albumin/Globulin Ratio: 1.5 (ref 1.2–2.2)
Albumin: 4.3 g/dL (ref 3.8–4.8)
Alkaline Phosphatase: 110 IU/L (ref 39–117)
BUN/Creatinine Ratio: 12 (ref 10–24)
BUN: 24 mg/dL (ref 8–27)
Bilirubin Total: <0.2 mg/dL (ref 0.0–1.2)
CO2: 23 mmol/L (ref 20–29)
Calcium: 9.1 mg/dL (ref 8.6–10.2)
Chloride: 105 mmol/L (ref 96–106)
Creatinine, Ser: 2.02 mg/dL — ABNORMAL HIGH (ref 0.76–1.27)
GFR calc Af Amer: 40 mL/min/{1.73_m2} — ABNORMAL LOW (ref >59)
GFR calc non Af Amer: 34 mL/min/{1.73_m2} — ABNORMAL LOW (ref >59)
Globulin, Total: 2.8 g/dL (ref 1.5–4.5)
Glucose: 92 mg/dL (ref 65–99)
Potassium: 4.6 mmol/L (ref 3.5–5.2)
Sodium: 141 mmol/L (ref 134–144)
Total Protein: 7.1 g/dL (ref 6.0–8.5)

## 2018-08-22 LAB — HEPATITIS C ANTIBODY: Hep C Virus Ab: 0.2 s/co ratio (ref 0.0–0.9)

## 2018-08-22 LAB — CBC WITH DIFFERENTIAL/PLATELET
Basophils Absolute: 0.1 10*3/uL (ref 0.0–0.2)
Basos: 1 %
EOS (ABSOLUTE): 0.3 10*3/uL (ref 0.0–0.4)
Eos: 3 %
Hematocrit: 33 % — ABNORMAL LOW (ref 37.5–51.0)
Hemoglobin: 10.7 g/dL — ABNORMAL LOW (ref 13.0–17.7)
Immature Grans (Abs): 0.1 10*3/uL (ref 0.0–0.1)
Immature Granulocytes: 1 %
Lymphocytes Absolute: 2.4 10*3/uL (ref 0.7–3.1)
Lymphs: 25 %
MCH: 30.7 pg (ref 26.6–33.0)
MCHC: 32.4 g/dL (ref 31.5–35.7)
MCV: 95 fL (ref 79–97)
Monocytes Absolute: 0.6 10*3/uL (ref 0.1–0.9)
Monocytes: 7 %
Neutrophils Absolute: 6.1 10*3/uL (ref 1.4–7.0)
Neutrophils: 63 %
Platelets: 426 10*3/uL (ref 150–450)
RBC: 3.49 x10E6/uL — ABNORMAL LOW (ref 4.14–5.80)
RDW: 12.2 % (ref 11.6–15.4)
WBC: 9.5 10*3/uL (ref 3.4–10.8)

## 2018-08-22 LAB — HIV ANTIBODY (ROUTINE TESTING W REFLEX): HIV Screen 4th Generation wRfx: NONREACTIVE

## 2018-08-26 ENCOUNTER — Telehealth: Payer: Self-pay | Admitting: Family Medicine

## 2018-08-26 NOTE — Telephone Encounter (Signed)
appt made.  Patient aware

## 2018-08-26 NOTE — Telephone Encounter (Signed)
Please contact the patient We can do virtual if he will drop by for BMP a day or two before appt. Also should check BP at hoome or get Triage to check it when he comes in for his blood test.

## 2018-08-26 NOTE — Telephone Encounter (Signed)
No available appts on Monday or Tuesday other than virtual appts.  Can patient have a virtual appt or does he just need labs?

## 2018-09-02 ENCOUNTER — Other Ambulatory Visit: Payer: 59

## 2018-09-02 DIAGNOSIS — R899 Unspecified abnormal finding in specimens from other organs, systems and tissues: Secondary | ICD-10-CM

## 2018-09-03 ENCOUNTER — Ambulatory Visit (INDEPENDENT_AMBULATORY_CARE_PROVIDER_SITE_OTHER): Payer: 59 | Admitting: Family Medicine

## 2018-09-03 ENCOUNTER — Encounter: Payer: Self-pay | Admitting: Family Medicine

## 2018-09-03 DIAGNOSIS — I10 Essential (primary) hypertension: Secondary | ICD-10-CM

## 2018-09-03 DIAGNOSIS — E119 Type 2 diabetes mellitus without complications: Secondary | ICD-10-CM | POA: Diagnosis not present

## 2018-09-03 DIAGNOSIS — E785 Hyperlipidemia, unspecified: Secondary | ICD-10-CM

## 2018-09-03 LAB — CMP14+EGFR
ALT: 13 IU/L (ref 0–44)
AST: 19 IU/L (ref 0–40)
Albumin/Globulin Ratio: 1.5 (ref 1.2–2.2)
Albumin: 4 g/dL (ref 3.8–4.8)
Alkaline Phosphatase: 113 IU/L (ref 39–117)
BUN/Creatinine Ratio: 10 (ref 10–24)
BUN: 16 mg/dL (ref 8–27)
Bilirubin Total: 0.2 mg/dL (ref 0.0–1.2)
CO2: 23 mmol/L (ref 20–29)
Calcium: 9.3 mg/dL (ref 8.6–10.2)
Chloride: 103 mmol/L (ref 96–106)
Creatinine, Ser: 1.55 mg/dL — ABNORMAL HIGH (ref 0.76–1.27)
GFR calc Af Amer: 55 mL/min/{1.73_m2} — ABNORMAL LOW (ref >59)
GFR calc non Af Amer: 47 mL/min/{1.73_m2} — ABNORMAL LOW (ref >59)
Globulin, Total: 2.7 g/dL (ref 1.5–4.5)
Glucose: 168 mg/dL — ABNORMAL HIGH (ref 65–99)
Potassium: 5.1 mmol/L (ref 3.5–5.2)
Sodium: 139 mmol/L (ref 134–144)
Total Protein: 6.7 g/dL (ref 6.0–8.5)

## 2018-09-03 LAB — CBC WITH DIFFERENTIAL/PLATELET
Basophils Absolute: 0.1 10*3/uL (ref 0.0–0.2)
Basos: 1 %
EOS (ABSOLUTE): 0.3 10*3/uL (ref 0.0–0.4)
Eos: 4 %
Hematocrit: 32.3 % — ABNORMAL LOW (ref 37.5–51.0)
Hemoglobin: 10.6 g/dL — ABNORMAL LOW (ref 13.0–17.7)
Immature Grans (Abs): 0 10*3/uL (ref 0.0–0.1)
Immature Granulocytes: 0 %
Lymphocytes Absolute: 1.6 10*3/uL (ref 0.7–3.1)
Lymphs: 21 %
MCH: 30.5 pg (ref 26.6–33.0)
MCHC: 32.8 g/dL (ref 31.5–35.7)
MCV: 93 fL (ref 79–97)
Monocytes Absolute: 0.7 10*3/uL (ref 0.1–0.9)
Monocytes: 8 %
Neutrophils Absolute: 5.1 10*3/uL (ref 1.4–7.0)
Neutrophils: 66 %
Platelets: 407 10*3/uL (ref 150–450)
RBC: 3.47 x10E6/uL — ABNORMAL LOW (ref 4.14–5.80)
RDW: 12.2 % (ref 11.6–15.4)
WBC: 7.8 10*3/uL (ref 3.4–10.8)

## 2018-09-03 MED ORDER — BLOOD GLUCOSE METER KIT: PACK | 0 refills | Status: DC

## 2018-09-03 MED ORDER — HYDRALAZINE HCL 25 MG PO TABS: 25.0000 mg | ORAL_TABLET | Freq: Three times a day (TID) | ORAL | 2 refills | Status: DC

## 2018-09-03 NOTE — Progress Notes (Signed)
Subjective:    Patient ID: Jim Sullivan, male    DOB: [birth year], 63 y.o.   MRN: [id number]   HPI: Hale Chalfin is a 63 y.o. male presenting for  presents for  follow-up of hypertension. Patient has no history of headache chest pain or shortness of breath or recent cough. Patient also denies symptoms of TIA such as focal numbness or weakness. Patient denies side effects from medication. States taking it regularly.  Pt. Has to get scrip for DM meter sent to [facility] outpt. Not covered at [facility]. As a result he is using strips that sre a year out of date and he doesn't trust them.  Depression screen [facility] 2/9 08/21/2018 02/20/2018  Decreased Interest 0 0  Down, Depressed, Hopeless 0 0  PHQ - 2 Score 0 0  Altered sleeping - 1  Tired, decreased energy - 1  Change in appetite - 0  Feeling bad or failure about yourself  - 0  Trouble concentrating - 0  Moving slowly or fidgety/restless - 0  Suicidal thoughts - 0  PHQ-9 Score - 2  Difficult doing work/chores - Not difficult at all     Relevant past medical, surgical, family and social history reviewed and updated as indicated.  Interim medical history since our last visit reviewed. Allergies and medications reviewed and updated.  ROS:  Review of Systems  Constitutional: Negative.  Negative for fever.  HENT: Negative.   Eyes: Negative for visual disturbance.  Respiratory: Negative for cough and shortness of breath.   Cardiovascular: Negative for chest pain and leg swelling.  Gastrointestinal: Negative for abdominal pain, diarrhea, nausea and vomiting.  Genitourinary: Negative for difficulty urinating.  Musculoskeletal: Negative for arthralgias and myalgias.  Skin: Negative for rash.  Neurological: Negative for headaches.  Psychiatric/Behavioral: Negative for sleep disturbance.     Social History   Tobacco Use  Smoking Status Former Smoker  . Packs/day: 0.50  . Years: 30.00  . Pack years: 15.00  . Types: Cigarettes  . Quit date:  01/16/2013  . Years since quitting: 5.6  Smokeless Tobacco Never Used       Objective:     Wt Readings from Last 3 Encounters:  08/21/18 217 lb (98.4 kg)  05/10/18 213 lb (96.6 kg)  02/20/18 225 lb 3.2 oz (102.2 kg)     Exam deferred. Pt. Harboring due to COVID 19. Phone visit performed.   Assessment & Plan:   1. Type 2 diabetes mellitus without complication, without long-term current use of insulin ([place])   2. Essential hypertension   3. Hyperlipidemia, unspecified hyperlipidemia type     Meds ordered this encounter  Medications  . DISCONTD: blood glucose meter kit and supplies    Sig: Dispense based on patient and insurance preference. Use up to four times daily as directed. (FOR ICD-10 E10.9, E11.9).    Dispense:  1 each    Refill:  0    Order Specific Question:   Number of strips    Answer:   100    Order Specific Question:   Number of lancets    Answer:   100  . blood glucose meter kit and supplies    Sig: Dispense based on patient and insurance preference. Use up to four times daily as directed. (FOR ICD-10 E10.9, E11.9).    Dispense:  1 each    Refill:  0    Order Specific Question:   Number of strips    Answer:   100  Order Specific Question:   Number of lancets    Answer:   100  . hydrALAZINE (APRESOLINE) 25 MG tablet    Sig: Take 1 tablet (25 mg total) by mouth 3 (three) times daily.    Dispense:  90 tablet    Refill:  2    No orders of the defined types were placed in this encounter.     Diagnoses and all orders for this visit:  Type 2 diabetes mellitus without complication, without long-term current use of insulin (HCC)  Essential hypertension -     hydrALAZINE (APRESOLINE) 25 MG tablet; Take 1 tablet (25 mg total) by mouth 3 (three) times daily.  Hyperlipidemia, unspecified hyperlipidemia type  Other orders -     Discontinue: blood glucose meter kit and supplies; Dispense based on patient and insurance preference. Use up to four times daily as  directed. (FOR ICD-10 E10.9, E11.9). -     blood glucose meter kit and supplies; Dispense based on patient and insurance preference. Use up to four times daily as directed. (FOR ICD-10 E10.9, E11.9).    Virtual Visit via telephone Note  I discussed the limitations, risks, security and privacy concerns of performing an evaluation and management service by telephone and the availability of in person appointments. The patient was identified with two identifiers. Pt.expressed understanding and agreed to proceed. Pt. Is at home. Dr. Livia Snellen is in his office.  Follow Up Instructions:   I discussed the assessment and treatment plan with the patient. The patient was provided an opportunity to ask questions and all were answered. The patient agreed with the plan and demonstrated an understanding of the instructions.   The patient was advised to call back or seek an in-person evaluation if the symptoms worsen or if the condition fails to improve as anticipated.   Total minutes including chart review and phone contact time: 25   Follow up plan: Return in about 1 month (around 10/04/2018).  Claretta Fraise, MD [place]

## 2018-09-10 ENCOUNTER — Telehealth: Payer: Self-pay | Admitting: Family Medicine

## 2018-09-10 MED ORDER — BLOOD GLUCOSE METER KIT: PACK | 1 refills | Status: DC

## 2018-09-10 MED FILL — hydrALAZINE HCL 25 MG TABS: 25 | 30 days supply | Qty: 90 | Fill #0

## 2018-09-10 NOTE — Telephone Encounter (Signed)
Printed and faxed BS device. Pt aware

## 2018-09-11 MED FILL — FREESTYLE LITE METER: 1 days supply | Qty: 1 | Fill #0

## 2018-09-11 MED FILL — FREESTYLE LITE TEST STRIP: 50 days supply | Qty: 100 | Fill #0

## 2018-09-11 MED FILL — FREESTYLE LANCETS: 50 days supply | Qty: 100 | Fill #0

## 2018-09-17 ENCOUNTER — Ambulatory Visit: Payer: 59 | Admitting: Family Medicine

## 2018-10-05 MED FILL — metFORMIN HCL ER 500 MG TB2: 500 | 90 days supply | Qty: 180 | Fill #1

## 2018-10-07 ENCOUNTER — Other Ambulatory Visit: Payer: Self-pay

## 2018-10-08 ENCOUNTER — Encounter: Payer: Self-pay | Admitting: Family Medicine

## 2018-10-08 ENCOUNTER — Ambulatory Visit (INDEPENDENT_AMBULATORY_CARE_PROVIDER_SITE_OTHER): Payer: 59 | Admitting: Family Medicine

## 2018-10-08 VITALS — BP 184/76 | HR 54 | Temp 97.8°F | Resp 20 | Ht 69.0 in | Wt 224.0 lb

## 2018-10-08 DIAGNOSIS — E119 Type 2 diabetes mellitus without complications: Secondary | ICD-10-CM | POA: Diagnosis not present

## 2018-10-08 DIAGNOSIS — I10 Essential (primary) hypertension: Secondary | ICD-10-CM | POA: Diagnosis not present

## 2018-10-08 NOTE — Progress Notes (Signed)
Subjective:  Patient ID: Jim Sullivan, male    DOB: 1955/10/18  Age: 63 y.o. MRN: [id number]  CC: Medical Management of Chronic Issues, Hypertension, and Diabetes   HPI Jim Sullivan presents for  follow-up of hypertension. Patient has no history of headache chest pain or shortness of breath or recent cough. Patient also denies symptoms of TIA such as focal numbness or weakness. Patient denies side effects from medication. States taking it at noon, 5 PM and 10 PM. Morning BP around 638 systolic, in evenings sometimes gets down to 145.  Blood glucose running 110-140 History Jim Sullivan has a past medical history of Diabetes mellitus without complication ([place]), Erectile dysfunction, GERD (gastroesophageal reflux disease), Hyperlipidemia, and Hypertension.   He has a past surgical history that includes Hernia repair (Right, 2019); Hernia repair (Left, infancy); and Small intestine surgery (2013).   His family history is not on file.He reports that he quit smoking about 5 years ago. His smoking use included cigarettes. He has a 15.00 pack-year smoking history. He has never used smokeless tobacco. No history on file for alcohol and drug.  Current Outpatient Medications on File Prior to Visit  Medication Sig Dispense Refill  . aspirin EC 81 MG tablet Take 81 mg by mouth daily.    Marland Kitchen atorvastatin (LIPITOR) 20 MG tablet Take 1 tablet (20 mg total) by mouth daily. 90 tablet 1  . blood glucose meter kit and supplies Dispense based on patient and insurance preference. Use BID and PRN to check blood sugar. Dx E11.9 1 each 1  . carvedilol (COREG) 25 MG tablet Take 1 tablet (25 mg total) by mouth 2 (two) times daily with a meal. 180 tablet 1  . hydrALAZINE (APRESOLINE) 25 MG tablet Take 1 tablet (25 mg total) by mouth 3 (three) times daily. 90 tablet 2  . metFORMIN (GLUCOPHAGE-XR) 500 MG 24 hr tablet Take 2 tablets (1,000 mg total) by mouth daily with breakfast. 180 tablet 1  . spironolactone (ALDACTONE) 25  MG tablet Take 1 tablet (25 mg total) by mouth daily. 90 tablet 1  . terazosin (HYTRIN) 5 MG capsule Take 1 capsule (5 mg total) by mouth at bedtime. 90 capsule 1  . Vitamin D, Ergocalciferol, (DRISDOL) 1.25 MG (50000 UT) CAPS capsule Take 1 capsule (50,000 Units total) by mouth every 7 (seven) days. 12 capsule 1   No current facility-administered medications on file prior to visit.     ROS Review of Systems  Constitutional: Negative for fever.  Respiratory: Negative for shortness of breath.   Cardiovascular: Negative for chest pain.  Musculoskeletal: Negative for arthralgias.  Skin: Negative for rash.    Objective:  BP (!) 184/76 (Cuff Size: Large)   Pulse (!) 54   Temp 97.8 F (36.6 C)   Resp 20   Ht _0  (1.753 m)   Wt 224 lb (101.6 kg)   SpO2 98%   BMI 33.08 kg/m   BP Readings from Last 3 Encounters:  10/08/18 (!) 184/76  08/21/18 130/62  05/10/18 (!) 169/73    Wt Readings from Last 3 Encounters:  10/08/18 224 lb (101.6 kg)  08/21/18 217 lb (98.4 kg)  05/10/18 213 lb (96.6 kg)     Physical Exam Vitals signs reviewed.  Constitutional:      Appearance: He is well-developed.  HENT:     Head: Normocephalic and atraumatic.     Right Ear: External ear normal.     Left Ear: External ear normal.     Mouth/Throat:  Pharynx: No oropharyngeal exudate or posterior oropharyngeal erythema.  Eyes:     Pupils: Pupils are equal, round, and reactive to light.  Neck:     Musculoskeletal: Normal range of motion and neck supple.  Cardiovascular:     Rate and Rhythm: Normal rate and regular rhythm.     Heart sounds: No murmur.  Pulmonary:     Effort: No respiratory distress.     Breath sounds: Normal breath sounds.  Neurological:     Mental Status: He is alert and oriented to person, place, and time.       Assessment & Plan:   Cynthia was seen today for medical management of chronic issues, hypertension and diabetes.  Diagnoses and all orders for this visit:   Essential hypertension  Type 2 diabetes mellitus without complication, without long-term current use of insulin (HCC)   Increase the hydralazine to 25 mg three time a day (roughly every 8 hours for 1 week. The second week take 50 mg (two tablets) in the morning and bedtime. Take 25 mg in the afternoon. The third week and beyond, take hydralazine 50 mg  Every eight hours   Allergies as of 10/08/2018      Reactions   Morphine And Related       Medication List       Accurate as of October 08, 2018 11:04 AM. If you have any questions, ask your nurse or doctor.        aspirin EC 81 MG tablet Take 81 mg by mouth daily.   atorvastatin 20 MG tablet Commonly known as: LIPITOR Take 1 tablet (20 mg total) by mouth daily.   blood glucose meter kit and supplies Dispense based on patient and insurance preference. Use BID and PRN to check blood sugar. Dx E11.9   carvedilol 25 MG tablet Commonly known as: COREG Take 1 tablet (25 mg total) by mouth 2 (two) times daily with a meal.   hydrALAZINE 25 MG tablet Commonly known as: APRESOLINE Take 1 tablet (25 mg total) by mouth 3 (three) times daily.   metFORMIN 500 MG 24 hr tablet Commonly known as: GLUCOPHAGE-XR Take 2 tablets (1,000 mg total) by mouth daily with breakfast.   spironolactone 25 MG tablet Commonly known as: ALDACTONE Take 1 tablet (25 mg total) by mouth daily.   terazosin 5 MG capsule Commonly known as: HYTRIN Take 1 capsule (5 mg total) by mouth at bedtime.   Vitamin D (Ergocalciferol) 1.25 MG (50000 UT) Caps capsule Commonly known as: DRISDOL Take 1 capsule (50,000 Units total) by mouth every 7 (seven) days.           Follow-up: Return in about 2 months (around 12/08/2018) for diabetes, hypertension.  Claretta Fraise, M.D.

## 2018-10-08 NOTE — Patient Instructions (Signed)
Increase the hydralazine to 25 mg three time a day (roughly every 8 hours for 1 week. The second week take 50 mg (two tablets) in the morning and bedtime. Take 25 mg in the afternoon. The third week and beyond, take hydralazine 50 mg  Every eight hours

## 2018-11-19 DIAGNOSIS — D1801 Hemangioma of skin and subcutaneous tissue: Secondary | ICD-10-CM | POA: Diagnosis not present

## 2018-11-19 DIAGNOSIS — L570 Actinic keratosis: Secondary | ICD-10-CM | POA: Diagnosis not present

## 2018-11-19 DIAGNOSIS — L821 Other seborrheic keratosis: Secondary | ICD-10-CM | POA: Diagnosis not present

## 2018-11-19 DIAGNOSIS — D485 Neoplasm of uncertain behavior of skin: Secondary | ICD-10-CM | POA: Diagnosis not present

## 2018-11-19 DIAGNOSIS — D225 Melanocytic nevi of trunk: Secondary | ICD-10-CM | POA: Diagnosis not present

## 2018-11-19 DIAGNOSIS — L819 Disorder of pigmentation, unspecified: Secondary | ICD-10-CM | POA: Diagnosis not present

## 2018-11-25 MED FILL — CARVEDILOL 25 MG TABLET: 25 | 90 days supply | Qty: 180 | Fill #1

## 2018-11-25 MED FILL — hydrALAZINE HCL 25 MG TABS: 25 | 30 days supply | Qty: 90 | Fill #1

## 2018-11-25 MED FILL — ATORVASTATIN 20 MG TABLET: 20 | 90 days supply | Qty: 90 | Fill #1

## 2018-11-25 MED FILL — SPIRONOLACTONE 25 MG TABS: 25 | 90 days supply | Qty: 90 | Fill #1

## 2018-11-25 MED FILL — TERAZOSIN 5 MG CAPSULE: 5 | 90 days supply | Qty: 90 | Fill #1

## 2018-11-25 MED FILL — VIT D2 1.25 MG (50,000 UNIT: 1.25 MG | 84 days supply | Qty: 12 | Fill #1

## 2018-12-10 ENCOUNTER — Ambulatory Visit: Payer: 59 | Admitting: Family Medicine

## 2019-01-07 MED FILL — metFORMIN HCL ER 500 MG TB2: 500 | 60 days supply | Qty: 120 | Fill #2

## 2019-01-07 MED FILL — hydrALAZINE HCL 25 MG TABS: 25 | 30 days supply | Qty: 90 | Fill #2

## 2019-01-27 ENCOUNTER — Other Ambulatory Visit: Payer: Self-pay

## 2019-01-28 ENCOUNTER — Encounter: Payer: Self-pay | Admitting: Family Medicine

## 2019-01-28 ENCOUNTER — Other Ambulatory Visit: Payer: Self-pay

## 2019-01-28 ENCOUNTER — Ambulatory Visit (INDEPENDENT_AMBULATORY_CARE_PROVIDER_SITE_OTHER): Payer: No Typology Code available for payment source | Admitting: Family Medicine

## 2019-01-28 VITALS — BP 133/83 | HR 60 | Temp 97.5°F | Ht 69.0 in | Wt 235.2 lb

## 2019-01-28 DIAGNOSIS — E559 Vitamin D deficiency, unspecified: Secondary | ICD-10-CM | POA: Diagnosis not present

## 2019-01-28 DIAGNOSIS — E785 Hyperlipidemia, unspecified: Secondary | ICD-10-CM

## 2019-01-28 DIAGNOSIS — I10 Essential (primary) hypertension: Secondary | ICD-10-CM | POA: Diagnosis not present

## 2019-01-28 DIAGNOSIS — E119 Type 2 diabetes mellitus without complications: Secondary | ICD-10-CM

## 2019-01-28 LAB — BAYER DCA HB A1C WAIVED: HB A1C (BAYER DCA - WAIVED): 7.5 % — ABNORMAL HIGH (ref <7.0)

## 2019-01-28 MED ORDER — SPIRONOLACTONE 25 MG PO TABS: 25.0000 mg | ORAL_TABLET | Freq: Every day | ORAL | 1 refills | Status: DC

## 2019-01-28 MED ORDER — HYDRALAZINE HCL 25 MG PO TABS: 25.0000 mg | ORAL_TABLET | Freq: Three times a day (TID) | ORAL | 2 refills | Status: DC

## 2019-01-28 MED ORDER — METFORMIN HCL ER 500 MG PO TB24: 1000.0000 mg | ORAL_TABLET | Freq: Every day | ORAL | 1 refills | Status: DC

## 2019-01-28 MED ORDER — VITAMIN D (ERGOCALCIFEROL) 1.25 MG (50000 UNIT) PO CAPS: 50000.0000 [IU] | ORAL_CAPSULE | ORAL | 1 refills | Status: DC

## 2019-01-28 MED ORDER — TERAZOSIN HCL 5 MG PO CAPS: 5.0000 mg | ORAL_CAPSULE | Freq: Every day | ORAL | 1 refills | Status: DC

## 2019-01-28 MED ORDER — CARVEDILOL 25 MG PO TABS: 25.0000 mg | ORAL_TABLET | Freq: Two times a day (BID) | ORAL | 1 refills | Status: DC

## 2019-01-28 MED ORDER — ATORVASTATIN CALCIUM 20 MG PO TABS: 20.0000 mg | ORAL_TABLET | Freq: Every day | ORAL | 1 refills | Status: DC

## 2019-01-28 NOTE — Progress Notes (Signed)
Subjective:  Patient ID: Jim Sullivan,  male    DOB: 28-Oct-1955  Age: 64 y.o.    CC: Follow-up (2 month)   HPI Jim Sullivan presents for  follow-up of hypertension. Patient has no history of headache chest pain or shortness of breath or recent cough. Patient also denies symptoms of TIA such as numbness weakness lateralizing. Patient denies side effects from medication. States taking it regularly.  Patient is not checking his blood pressure regularly did check it recently and it was 148/75.  Patient admits that with regard to blood pressure and diabetes his diet and exercise terrible.  This is based on medically increased stress.  His son-in-law died suddenly recently.  His daughter has 2 kids back in [place].  Patient has been in 6 weeks with her recently trying to help her down to the new lifestyle.  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  Patient also  in for follow-up of elevated cholesterol. Doing well without complaints on current medication. Denies side effects  including myalgia and arthralgia and nausea. Also in today for liver function testing. Currently no chest pain, shortness of breath or other cardiovascular related symptoms noted.  Follow-up of diabetes. Patient does check blood sugar at home. Readings run between 108 and 130s Patient denies symptoms such as excessive hunger or urinary frequency, excessive hunger, nausea No significant hypoglycemic spells noted. Medications reviewed. Pt reports taking them regularly. Pt. denies complication/adverse reaction today.    History Jim Sullivan has a past medical history of Diabetes mellitus without complication ([place]), Erectile dysfunction, GERD (gastroesophageal reflux disease), Hyperlipidemia, and Hypertension.   He has a past surgical  history that includes Hernia repair (Right, 2019); Hernia repair (Left, infancy); and Small intestine surgery (2013).   His family history is not on file.He reports that he quit smoking about 6 years ago. His smoking use included cigarettes. He has a 15.00 pack-year smoking history. He has never used smokeless tobacco. No history on file for alcohol and drug.  Current Outpatient Medications on File Prior to Visit  Medication Sig Dispense Refill  . aspirin EC 81 MG tablet Take 81 mg by mouth daily.    . blood glucose meter kit and supplies Dispense based on patient and insurance preference. Use BID and PRN to check blood sugar. Dx E11.9 1 each 1   No current facility-administered medications on file prior to visit.    ROS Review of Systems  Constitutional: Negative for fever.  Respiratory: Negative for shortness of breath.   Cardiovascular: Negative for chest pain.  Musculoskeletal: Negative for arthralgias.  Skin: Negative for rash.  Psychiatric/Behavioral: Negative for agitation. The patient is not nervous/anxious.     Objective:  BP 133/83   Pulse 60   Temp (!) 97.5 F (36.4 C) (Temporal)   Ht 5' 9"  (1.753 m)   Wt 235 lb 3.2 oz (106.7 kg)   BMI 34.73 kg/m   BP Readings from Last 3 Encounters:  01/28/19 133/83  10/08/18 (!) 184/76  08/21/18 130/62    Wt Readings from Last 3 Encounters:  01/28/19 235 lb 3.2 oz (106.7 kg)  10/08/18 224 lb (101.6 kg)  08/21/18 217 lb (98.4 kg)     Physical Exam Vitals reviewed.  Constitutional:      Appearance: He is well-developed.  HENT:     Head: Normocephalic and atraumatic.     Right Ear: Tympanic membrane and external ear normal. No decreased hearing noted.     Left Ear: Tympanic membrane and external ear normal. No decreased hearing noted.     Mouth/Throat:     Pharynx: No oropharyngeal exudate or posterior oropharyngeal erythema.  Eyes:     Pupils: Pupils are equal, round, and reactive to light.  Cardiovascular:      Rate and Rhythm: Normal rate and regular rhythm.     Heart sounds: No murmur.  Pulmonary:     Effort: No respiratory distress.     Breath sounds: Normal breath sounds.  Abdominal:     General: Bowel sounds are normal.     Palpations: Abdomen is soft. There is no mass.     Tenderness: There is no abdominal tenderness.  Musculoskeletal:     Cervical back: Normal range of motion and neck supple.         Assessment & Plan:   Jim Sullivan was seen today for follow-up.  Diagnoses and all orders for this visit:  Type 2 diabetes mellitus without complication, without long-term current use of insulin (HCC) -     CMP14+EGFR -     CBC with Differential/Platelet -     hgba1c -     metFORMIN (GLUCOPHAGE-XR) 500 MG 24 hr tablet; Take 2 tablets (1,000 mg total) by mouth daily with breakfast.  Essential hypertension -     CMP14+EGFR -     CBC with Differential/Platelet -     carvedilol (COREG) 25 MG tablet; Take 1 tablet (25 mg total) by mouth 2 (two) times daily with a meal. -     hydrALAZINE (APRESOLINE) 25 MG tablet; Take 1 tablet (25 mg total) by mouth 3 (three) times daily. -     spironolactone (ALDACTONE) 25 MG tablet; Take 1 tablet (25 mg total) by mouth daily. -     terazosin (HYTRIN) 5 MG capsule; Take 1 capsule (5 mg total) by mouth at bedtime.  Hyperlipidemia, unspecified hyperlipidemia type -     atorvastatin (LIPITOR) 20 MG tablet; Take 1 tablet (20 mg total) by mouth daily.  Vitamin D deficiency -     Vitamin D, Ergocalciferol, (DRISDOL) 1.25 MG (50000 UNIT) CAPS capsule; Take 1 capsule (50,000 Units total) by mouth every 7 (seven) days.   I have changed Jim Sullivan's Vitamin D (Ergocalciferol). I am also having him maintain his aspirin EC, blood glucose meter kit and supplies, atorvastatin, carvedilol, hydrALAZINE, metFORMIN, spironolactone, and terazosin.  Meds ordered this encounter  Medications  . atorvastatin (LIPITOR) 20 MG tablet    Sig: Take 1 tablet (20 mg total)  by mouth daily.    Dispense:  90 tablet    Refill:  1  . carvedilol (COREG) 25 MG tablet    Sig: Take 1 tablet (25 mg total) by mouth 2 (two) times daily with a meal.    Dispense:  180 tablet    Refill:  1  . hydrALAZINE (APRESOLINE) 25 MG tablet    Sig: Take 1 tablet (25 mg total) by mouth 3 (three) times daily.    Dispense:  90 tablet    Refill:  2  . metFORMIN (GLUCOPHAGE-XR) 500 MG 24 hr tablet    Sig: Take 2 tablets (1,000 mg total) by mouth daily with breakfast.    Dispense:  180 tablet    Refill:  1  . spironolactone (ALDACTONE) 25 MG tablet  Sig: Take 1 tablet (25 mg total) by mouth daily.    Dispense:  90 tablet    Refill:  1  . terazosin (HYTRIN) 5 MG capsule    Sig: Take 1 capsule (5 mg total) by mouth at bedtime.    Dispense:  90 capsule    Refill:  1  . Vitamin D, Ergocalciferol, (DRISDOL) 1.25 MG (50000 UNIT) CAPS capsule    Sig: Take 1 capsule (50,000 Units total) by mouth every 7 (seven) days.    Dispense:  12 capsule    Refill:  1     Follow-up: Return in about 3 months (around 04/28/2019).  Claretta Fraise, M.D.

## 2019-01-29 LAB — CMP14+EGFR
ALT: 17 IU/L (ref 0–44)
AST: 21 IU/L (ref 0–40)
Albumin/Globulin Ratio: 1.3 (ref 1.2–2.2)
Albumin: 4 g/dL (ref 3.8–4.8)
Alkaline Phosphatase: 133 IU/L — ABNORMAL HIGH (ref 39–117)
BUN/Creatinine Ratio: 12 (ref 10–24)
BUN: 20 mg/dL (ref 8–27)
Bilirubin Total: 0.4 mg/dL (ref 0.0–1.2)
CO2: 23 mmol/L (ref 20–29)
Calcium: 9.9 mg/dL (ref 8.6–10.2)
Chloride: 101 mmol/L (ref 96–106)
Creatinine, Ser: 1.69 mg/dL — ABNORMAL HIGH (ref 0.76–1.27)
GFR calc Af Amer: 49 mL/min/{1.73_m2} — ABNORMAL LOW (ref >59)
GFR calc non Af Amer: 42 mL/min/{1.73_m2} — ABNORMAL LOW (ref >59)
Globulin, Total: 3 g/dL (ref 1.5–4.5)
Glucose: 200 mg/dL — ABNORMAL HIGH (ref 65–99)
Potassium: 5.4 mmol/L — ABNORMAL HIGH (ref 3.5–5.2)
Sodium: 138 mmol/L (ref 134–144)
Total Protein: 7 g/dL (ref 6.0–8.5)

## 2019-01-29 LAB — CBC WITH DIFFERENTIAL/PLATELET
Basophils Absolute: 0.1 10*3/uL (ref 0.0–0.2)
Basos: 1 %
EOS (ABSOLUTE): 0.3 10*3/uL (ref 0.0–0.4)
Eos: 4 %
Hematocrit: 36.5 % — ABNORMAL LOW (ref 37.5–51.0)
Hemoglobin: 12.2 g/dL — ABNORMAL LOW (ref 13.0–17.7)
Immature Grans (Abs): 0 10*3/uL (ref 0.0–0.1)
Immature Granulocytes: 0 %
Lymphocytes Absolute: 1.5 10*3/uL (ref 0.7–3.1)
Lymphs: 20 %
MCH: 30.6 pg (ref 26.6–33.0)
MCHC: 33.4 g/dL (ref 31.5–35.7)
MCV: 92 fL (ref 79–97)
Monocytes Absolute: 0.7 10*3/uL (ref 0.1–0.9)
Monocytes: 9 %
Neutrophils Absolute: 5.2 10*3/uL (ref 1.4–7.0)
Neutrophils: 66 %
Platelets: 373 10*3/uL (ref 150–450)
RBC: 3.99 x10E6/uL — ABNORMAL LOW (ref 4.14–5.80)
RDW: 12.3 % (ref 11.6–15.4)
WBC: 7.7 10*3/uL (ref 3.4–10.8)

## 2019-02-18 MED FILL — TERAZOSIN 5 MG CAPSULE: 5 | 90 days supply | Qty: 90 | Fill #0

## 2019-02-18 MED FILL — hydrALAZINE HCL 25 MG TABS: 25 | 90 days supply | Qty: 270 | Fill #0

## 2019-02-18 MED FILL — VIT D2 1.25 MG (50,000 UNIT: 1.25 MG | 84 days supply | Qty: 12 | Fill #0

## 2019-03-25 MED FILL — ATORVASTATIN 20 MG TABLET: 20 | 90 days supply | Qty: 90 | Fill #0

## 2019-03-25 MED FILL — metFORMIN HCL ER 500 MG TB2: 500 | 90 days supply | Qty: 180 | Fill #0

## 2019-03-25 MED FILL — CARVEDILOL 25 MG TABLET: 25 | 90 days supply | Qty: 180 | Fill #0

## 2019-03-25 MED FILL — SPIRONOLACTONE 25 MG TABS: 25 | 90 days supply | Qty: 90 | Fill #0

## 2019-04-29 ENCOUNTER — Ambulatory Visit: Payer: No Typology Code available for payment source | Admitting: Family Medicine

## 2019-05-22 ENCOUNTER — Ambulatory Visit (INDEPENDENT_AMBULATORY_CARE_PROVIDER_SITE_OTHER): Payer: No Typology Code available for payment source | Admitting: Family Medicine

## 2019-05-22 ENCOUNTER — Encounter: Payer: Self-pay | Admitting: Family Medicine

## 2019-05-22 ENCOUNTER — Other Ambulatory Visit: Payer: Self-pay

## 2019-05-22 VITALS — BP 156/75 | HR 59 | Temp 97.6°F | Ht 69.0 in | Wt 236.0 lb

## 2019-05-22 DIAGNOSIS — E785 Hyperlipidemia, unspecified: Secondary | ICD-10-CM | POA: Diagnosis not present

## 2019-05-22 DIAGNOSIS — E1169 Type 2 diabetes mellitus with other specified complication: Secondary | ICD-10-CM | POA: Diagnosis not present

## 2019-05-22 DIAGNOSIS — Z125 Encounter for screening for malignant neoplasm of prostate: Secondary | ICD-10-CM

## 2019-05-22 DIAGNOSIS — E559 Vitamin D deficiency, unspecified: Secondary | ICD-10-CM

## 2019-05-22 DIAGNOSIS — I10 Essential (primary) hypertension: Secondary | ICD-10-CM | POA: Diagnosis not present

## 2019-05-22 DIAGNOSIS — E119 Type 2 diabetes mellitus without complications: Secondary | ICD-10-CM

## 2019-05-22 LAB — BAYER DCA HB A1C WAIVED: HB A1C (BAYER DCA - WAIVED): 7.8 % — ABNORMAL HIGH (ref <7.0)

## 2019-05-22 MED ORDER — SITAGLIPTIN PHOSPHATE 100 MG PO TABS: 100.0000 mg | ORAL_TABLET | Freq: Every day | ORAL | 2 refills | Status: DC

## 2019-05-22 MED ORDER — CARVEDILOL 25 MG PO TABS: 25.0000 mg | ORAL_TABLET | Freq: Two times a day (BID) | ORAL | 1 refills | Status: DC

## 2019-05-22 MED ORDER — VITAMIN D (ERGOCALCIFEROL) 1.25 MG (50000 UNIT) PO CAPS: 50000.0000 [IU] | ORAL_CAPSULE | ORAL | 1 refills | Status: DC

## 2019-05-22 MED ORDER — SPIRONOLACTONE 25 MG PO TABS: 25.0000 mg | ORAL_TABLET | Freq: Every day | ORAL | 1 refills | Status: DC

## 2019-05-22 MED ORDER — METFORMIN HCL ER 500 MG PO TB24: 1000.0000 mg | ORAL_TABLET | Freq: Every day | ORAL | 1 refills | Status: DC

## 2019-05-22 MED ORDER — HYDRALAZINE HCL 50 MG PO TABS: 50.0000 mg | ORAL_TABLET | Freq: Three times a day (TID) | ORAL | 1 refills | Status: DC

## 2019-05-22 MED ORDER — TERAZOSIN HCL 5 MG PO CAPS: 5.0000 mg | ORAL_CAPSULE | Freq: Every day | ORAL | 1 refills | Status: DC

## 2019-05-22 MED ORDER — ATORVASTATIN CALCIUM 20 MG PO TABS: 20.0000 mg | ORAL_TABLET | Freq: Every day | ORAL | 1 refills | Status: DC

## 2019-05-22 MED FILL — JANUVIA 100 MG TABLET: 100 | 30 days supply | Qty: 30 | Fill #0

## 2019-05-22 MED FILL — hydrALAZINE HCL 50 MG TABS: 50 | 90 days supply | Qty: 270 | Fill #0

## 2019-05-22 MED FILL — VIT D2 1.25 MG (50,000 UNIT: 1.25 MG | 84 days supply | Qty: 12 | Fill #0

## 2019-05-22 MED FILL — TERAZOSIN 5 MG CAPSULE: 5 | 90 days supply | Qty: 90 | Fill #0

## 2019-05-22 NOTE — Progress Notes (Signed)
Subjective:  Patient ID: Jim Sullivan,  male    DOB: Aug 14, 1955  Age: 64 y.o.    CC: Follow-up (3 month)   HPI Jim Sullivan presents for  follow-up of hypertension. Patient has no history of headache chest pain or shortness of breath or recent cough. Patient also denies symptoms of TIA such as numbness weakness lateralizing. Patient denies side effects from medication. States taking it regularly.  He has had multiple readings on his blood pressure both at home and another locales.  The lowest he can remember is 154/62.  Patient also  in for follow-up of elevated cholesterol. Doing well without complaints on current medication. Denies side effects  including myalgia and arthralgia and nausea. Also in today for liver function testing. Currently no chest pain, shortness of breath or other cardiovascular related symptoms noted.  Follow-up of diabetes. Patient does check blood sugar at home. Readings run between 100 fasting and 123-140 PP Patient denies symptoms such as excessive hunger or urinary frequency, excessive hunger, nausea No significant hypoglycemic spells noted. Medications reviewed. Pt reports taking them regularly. Pt. denies complication/adverse reaction today.    History Jim Sullivan has a past medical history of Diabetes mellitus without complication ([place]), Erectile dysfunction, GERD (gastroesophageal reflux disease), Hyperlipidemia, and Hypertension.   He has a past surgical history that includes Hernia repair (Right, 2019); Hernia repair (Left, infancy); and Small intestine surgery (2013).   His family history is not on file.He reports that he quit smoking about 6 years ago. His smoking use included cigarettes. He has a 15.00 pack-year smoking history. He has never used smokeless tobacco. No history on file for alcohol and drug.  Current Outpatient Medications on File Prior to Visit  Medication Sig Dispense Refill  . aspirin EC 81 MG tablet Take 81 mg by mouth daily.    .  blood glucose meter kit and supplies Dispense based on patient and insurance preference. Use BID and PRN to check blood sugar. Dx E11.9 1 each 1   No current facility-administered medications on file prior to visit.    ROS Review of Systems  Constitutional: Negative.   HENT: Negative.   Eyes: Negative for visual disturbance.  Respiratory: Negative for cough and shortness of breath.   Cardiovascular: Negative for chest pain and leg swelling.  Gastrointestinal: Negative for abdominal pain, diarrhea, nausea and vomiting.  Genitourinary: Negative for difficulty urinating.  Musculoskeletal: Negative for arthralgias and myalgias.  Skin: Negative for rash.  Neurological: Negative for headaches.  Psychiatric/Behavioral: Negative for sleep disturbance.    Objective:  BP (!) 156/75   Pulse (!) 59   Temp 97.6 F (36.4 C) (Temporal)   Ht _0  (1.753 m)   Wt 236 lb (107 kg)   BMI 34.85 kg/m   BP Readings from Last 3 Encounters:  05/22/19 (!) 156/75  01/28/19 133/83  10/08/18 (!) 184/76    Wt Readings from Last 3 Encounters:  05/22/19 236 lb (107 kg)  01/28/19 235 lb 3.2 oz (106.7 kg)  10/08/18 224 lb (101.6 kg)     Assessment & Plan:   Jim Sullivan was seen today for follow-up.  Diagnoses and all orders for this visit:  Type 2 diabetes mellitus with other specified complication, without long-term current use of insulin (HCC) -     CBC with Differential/Platelet -     CMP14+EGFR -     Lipid panel -     Bayer DCA Hb A1c Waived -     sitaGLIPtin (JANUVIA) 100 MG tablet;  Take 1 tablet (100 mg total) by mouth daily.  Essential hypertension -     CBC with Differential/Platelet -     CMP14+EGFR -     Lipid panel -     carvedilol (COREG) 25 MG tablet; Take 1 tablet (25 mg total) by mouth 2 (two) times daily with a meal. -     hydrALAZINE (APRESOLINE) 50 MG tablet; Take 1 tablet (50 mg total) by mouth 3 (three) times daily. -     spironolactone (ALDACTONE) 25 MG tablet; Take 1  tablet (25 mg total) by mouth daily. -     terazosin (HYTRIN) 5 MG capsule; Take 1 capsule (5 mg total) by mouth at bedtime.  Hyperlipidemia, unspecified hyperlipidemia type -     CBC with Differential/Platelet -     CMP14+EGFR -     Lipid panel -     atorvastatin (LIPITOR) 20 MG tablet; Take 1 tablet (20 mg total) by mouth daily.  Vitamin D deficiency -     CBC with Differential/Platelet -     CMP14+EGFR -     VITAMIN D 25 Hydroxy (Vit-D Deficiency, Fractures) -     Vitamin D, Ergocalciferol, (DRISDOL) 1.25 MG (50000 UNIT) CAPS capsule; Take 1 capsule (50,000 Units total) by mouth every 7 (seven) days.  Screening for prostate cancer -     CBC with Differential/Platelet -     CMP14+EGFR -     PSA, total and free  Type 2 diabetes mellitus without complication, without long-term current use of insulin (HCC) -     metFORMIN (GLUCOPHAGE-XR) 500 MG 24 hr tablet; Take 2 tablets (1,000 mg total) by mouth daily with breakfast.   I have changed Jim Sullivan's hydrALAZINE. I am also having him start on sitaGLIPtin. Additionally, I am having him maintain his aspirin EC, blood glucose meter kit and supplies, atorvastatin, carvedilol, metFORMIN, spironolactone, terazosin, and Vitamin D (Ergocalciferol).  Meds ordered this encounter  Medications  . sitaGLIPtin (JANUVIA) 100 MG tablet    Sig: Take 1 tablet (100 mg total) by mouth daily.    Dispense:  30 tablet    Refill:  2  . atorvastatin (LIPITOR) 20 MG tablet    Sig: Take 1 tablet (20 mg total) by mouth daily.    Dispense:  90 tablet    Refill:  1  . carvedilol (COREG) 25 MG tablet    Sig: Take 1 tablet (25 mg total) by mouth 2 (two) times daily with a meal.    Dispense:  180 tablet    Refill:  1  . hydrALAZINE (APRESOLINE) 50 MG tablet    Sig: Take 1 tablet (50 mg total) by mouth 3 (three) times daily.    Dispense:  270 tablet    Refill:  1  . metFORMIN (GLUCOPHAGE-XR) 500 MG 24 hr tablet    Sig: Take 2 tablets (1,000 mg total) by  mouth daily with breakfast.    Dispense:  180 tablet    Refill:  1  . spironolactone (ALDACTONE) 25 MG tablet    Sig: Take 1 tablet (25 mg total) by mouth daily.    Dispense:  90 tablet    Refill:  1  . terazosin (HYTRIN) 5 MG capsule    Sig: Take 1 capsule (5 mg total) by mouth at bedtime.    Dispense:  90 capsule    Refill:  1  . Vitamin D, Ergocalciferol, (DRISDOL) 1.25 MG (50000 UNIT) CAPS capsule    Sig: Take 1 capsule (50,000  Units total) by mouth every 7 (seven) days.    Dispense:  12 capsule    Refill:  1     Follow-up: Return in about 3 months (around 08/22/2019), or if symptoms worsen or fail to improve.  Claretta Fraise, M.D.

## 2019-05-23 LAB — CMP14+EGFR
ALT: 16 IU/L (ref 0–44)
AST: 20 IU/L (ref 0–40)
Albumin/Globulin Ratio: 1.4 (ref 1.2–2.2)
Albumin: 4.2 g/dL (ref 3.8–4.8)
Alkaline Phosphatase: 128 IU/L — ABNORMAL HIGH (ref 39–117)
BUN/Creatinine Ratio: 14 (ref 10–24)
BUN: 23 mg/dL (ref 8–27)
Bilirubin Total: 0.3 mg/dL (ref 0.0–1.2)
CO2: 23 mmol/L (ref 20–29)
Calcium: 9.3 mg/dL (ref 8.6–10.2)
Chloride: 102 mmol/L (ref 96–106)
Creatinine, Ser: 1.61 mg/dL — ABNORMAL HIGH (ref 0.76–1.27)
GFR calc Af Amer: 52 mL/min/{1.73_m2} — ABNORMAL LOW (ref >59)
GFR calc non Af Amer: 45 mL/min/{1.73_m2} — ABNORMAL LOW (ref >59)
Globulin, Total: 3 g/dL (ref 1.5–4.5)
Glucose: 114 mg/dL — ABNORMAL HIGH (ref 65–99)
Potassium: 5.4 mmol/L — ABNORMAL HIGH (ref 3.5–5.2)
Sodium: 138 mmol/L (ref 134–144)
Total Protein: 7.2 g/dL (ref 6.0–8.5)

## 2019-05-23 LAB — CBC WITH DIFFERENTIAL/PLATELET
Basophils Absolute: 0.1 10*3/uL (ref 0.0–0.2)
Basos: 1 %
EOS (ABSOLUTE): 0.3 10*3/uL (ref 0.0–0.4)
Eos: 3 %
Hematocrit: 35.4 % — ABNORMAL LOW (ref 37.5–51.0)
Hemoglobin: 12.1 g/dL — ABNORMAL LOW (ref 13.0–17.7)
Immature Grans (Abs): 0 10*3/uL (ref 0.0–0.1)
Immature Granulocytes: 0 %
Lymphocytes Absolute: 2 10*3/uL (ref 0.7–3.1)
Lymphs: 24 %
MCH: 30.6 pg (ref 26.6–33.0)
MCHC: 34.2 g/dL (ref 31.5–35.7)
MCV: 89 fL (ref 79–97)
Monocytes Absolute: 0.7 10*3/uL (ref 0.1–0.9)
Monocytes: 9 %
Neutrophils Absolute: 5.4 10*3/uL (ref 1.4–7.0)
Neutrophils: 63 %
Platelets: 431 10*3/uL (ref 150–450)
RBC: 3.96 x10E6/uL — ABNORMAL LOW (ref 4.14–5.80)
RDW: 12.6 % (ref 11.6–15.4)
WBC: 8.4 10*3/uL (ref 3.4–10.8)

## 2019-05-23 LAB — LIPID PANEL
Chol/HDL Ratio: 4.7 ratio (ref 0.0–5.0)
Cholesterol, Total: 112 mg/dL (ref 100–199)
HDL: 24 mg/dL — ABNORMAL LOW (ref >39)
LDL Chol Calc (NIH): 58 mg/dL (ref 0–99)
Triglycerides: 180 mg/dL — ABNORMAL HIGH (ref 0–149)
VLDL Cholesterol Cal: 30 mg/dL (ref 5–40)

## 2019-05-23 LAB — PSA, TOTAL AND FREE
PSA, Free Pct: 46.7 %
PSA, Free: 0.42 ng/mL
Prostate Specific Ag, Serum: 0.9 ng/mL (ref 0.0–4.0)

## 2019-05-23 LAB — VITAMIN D 25 HYDROXY (VIT D DEFICIENCY, FRACTURES): Vit D, 25-Hydroxy: 51.4 ng/mL (ref 30.0–100.0)

## 2019-05-26 ENCOUNTER — Telehealth: Payer: Self-pay | Admitting: Family Medicine

## 2019-05-26 ENCOUNTER — Encounter: Payer: Self-pay | Admitting: Family Medicine

## 2019-05-26 NOTE — Progress Notes (Signed)
HOME NUMBER BUSY SIGNAL ONLY NO ANSWER ON MOBILE

## 2019-05-26 NOTE — Telephone Encounter (Signed)
FYI: Pt returned missed call from [facility] regarding lab results. Went over results with pt per Dr Livia Snellen notes. Pt voiced understanding.

## 2019-06-03 ENCOUNTER — Encounter: Payer: Self-pay | Admitting: Family Medicine

## 2019-06-03 ENCOUNTER — Other Ambulatory Visit: Payer: Self-pay | Admitting: Family Medicine

## 2019-06-03 ENCOUNTER — Telehealth: Payer: Self-pay | Admitting: Family Medicine

## 2019-06-03 DIAGNOSIS — M21949 Unspecified acquired deformity of hand, unspecified hand: Secondary | ICD-10-CM | POA: Insufficient documentation

## 2019-06-03 NOTE — Telephone Encounter (Signed)
I ordered the referral

## 2019-06-03 NOTE — Telephone Encounter (Signed)
  REFERRAL REQUEST Telephone Note 06/03/2019  What type of referral do you need? [facility]  Have you been seen at our office for this problem? Yes on 05/22/19  Is there a particular doctor or location that you prefer? Dr Roseanne Kaufman  Pt said he already called their office and made the appt to see him in July but needs referral for insurance. Spoke to Dr Livia Snellen about getting referral when he had appt on 05/22/19. Pt said he has also already spoken to his insurance about it too.

## 2019-06-03 NOTE — Telephone Encounter (Signed)
Pt made aware

## 2019-06-09 ENCOUNTER — Telehealth: Payer: Self-pay | Admitting: Family Medicine

## 2019-06-09 NOTE — Telephone Encounter (Signed)
  REFERRAL REQUEST Telephone Note 06/09/2019  What type of referral do you need? Ophthalmology   Have you been seen at our office for this problem? NO - needs Ref for Yearly (Advise that they may need an appointment with their PCP before a referral can be done)  Is there a particular doctor or location that you prefer? [facility] Ophthalmology - Dr. Satira Sark  Patient notified that referrals can take up to a week or longer to process. If they haven't heard anything within a week they should call back and speak with the referral department.

## 2019-06-10 ENCOUNTER — Other Ambulatory Visit: Payer: Self-pay | Admitting: Family Medicine

## 2019-06-10 DIAGNOSIS — E119 Type 2 diabetes mellitus without complications: Secondary | ICD-10-CM

## 2019-06-17 MED FILL — SPIRONOLACTONE 25 MG TABS: 25 | 90 days supply | Qty: 90 | Fill #1

## 2019-06-17 MED FILL — metFORMIN HCL ER 500 MG TB2: 500 | 90 days supply | Qty: 180 | Fill #1

## 2019-06-17 MED FILL — JANUVIA 100 MG TABLET: 100 | 30 days supply | Qty: 30 | Fill #1

## 2019-06-17 MED FILL — CARVEDILOL 25 MG TABLET: 25 | 90 days supply | Qty: 180 | Fill #1

## 2019-06-23 ENCOUNTER — Emergency Department (HOSPITAL_COMMUNITY): Payer: No Typology Code available for payment source

## 2019-06-23 ENCOUNTER — Inpatient Hospital Stay (HOSPITAL_COMMUNITY)
Admission: EM | Admit: 2019-06-23 | Discharge: 2019-06-25 | DRG: 389 | Disposition: A | Discharge status: Home / Self Care | Payer: No Typology Code available for payment source | Attending: Internal Medicine | Admitting: Internal Medicine

## 2019-06-23 ENCOUNTER — Encounter (HOSPITAL_COMMUNITY): Payer: Self-pay | Admitting: *Deleted

## 2019-06-23 ENCOUNTER — Other Ambulatory Visit: Payer: Self-pay

## 2019-06-23 DIAGNOSIS — J302 Other seasonal allergic rhinitis: Secondary | ICD-10-CM | POA: Diagnosis present

## 2019-06-23 DIAGNOSIS — Z951 Presence of aortocoronary bypass graft: Secondary | ICD-10-CM

## 2019-06-23 DIAGNOSIS — Z87891 Personal history of nicotine dependence: Secondary | ICD-10-CM | POA: Diagnosis not present

## 2019-06-23 DIAGNOSIS — K56600 Partial intestinal obstruction, unspecified as to cause: Secondary | ICD-10-CM | POA: Diagnosis present

## 2019-06-23 DIAGNOSIS — K5651 Intestinal adhesions [bands], with partial obstruction: Secondary | ICD-10-CM | POA: Diagnosis not present

## 2019-06-23 DIAGNOSIS — I129 Hypertensive chronic kidney disease with stage 1 through stage 4 chronic kidney disease, or unspecified chronic kidney disease: Secondary | ICD-10-CM | POA: Diagnosis present

## 2019-06-23 DIAGNOSIS — Z91040 Latex allergy status: Secondary | ICD-10-CM

## 2019-06-23 DIAGNOSIS — K219 Gastro-esophageal reflux disease without esophagitis: Secondary | ICD-10-CM | POA: Diagnosis present

## 2019-06-23 DIAGNOSIS — Z79899 Other long term (current) drug therapy: Secondary | ICD-10-CM

## 2019-06-23 DIAGNOSIS — Z885 Allergy status to narcotic agent status: Secondary | ICD-10-CM | POA: Diagnosis not present

## 2019-06-23 DIAGNOSIS — Z20822 Contact with and (suspected) exposure to covid-19: Secondary | ICD-10-CM | POA: Diagnosis present

## 2019-06-23 DIAGNOSIS — Z888 Allergy status to other drugs, medicaments and biological substances status: Secondary | ICD-10-CM

## 2019-06-23 DIAGNOSIS — E1122 Type 2 diabetes mellitus with diabetic chronic kidney disease: Secondary | ICD-10-CM | POA: Diagnosis present

## 2019-06-23 DIAGNOSIS — K5790 Diverticulosis of intestine, part unspecified, without perforation or abscess without bleeding: Secondary | ICD-10-CM | POA: Diagnosis present

## 2019-06-23 DIAGNOSIS — Z7982 Long term (current) use of aspirin: Secondary | ICD-10-CM

## 2019-06-23 DIAGNOSIS — K56609 Unspecified intestinal obstruction, unspecified as to partial versus complete obstruction: Secondary | ICD-10-CM

## 2019-06-23 DIAGNOSIS — E785 Hyperlipidemia, unspecified: Secondary | ICD-10-CM | POA: Diagnosis present

## 2019-06-23 DIAGNOSIS — I10 Essential (primary) hypertension: Secondary | ICD-10-CM | POA: Diagnosis present

## 2019-06-23 DIAGNOSIS — N289 Disorder of kidney and ureter, unspecified: Secondary | ICD-10-CM

## 2019-06-23 DIAGNOSIS — Z8249 Family history of ischemic heart disease and other diseases of the circulatory system: Secondary | ICD-10-CM | POA: Diagnosis not present

## 2019-06-23 DIAGNOSIS — R109 Unspecified abdominal pain: Secondary | ICD-10-CM | POA: Diagnosis present

## 2019-06-23 DIAGNOSIS — R1084 Generalized abdominal pain: Secondary | ICD-10-CM | POA: Diagnosis not present

## 2019-06-23 DIAGNOSIS — N1831 Chronic kidney disease, stage 3a: Secondary | ICD-10-CM | POA: Diagnosis present

## 2019-06-23 DIAGNOSIS — E860 Dehydration: Secondary | ICD-10-CM | POA: Diagnosis present

## 2019-06-23 DIAGNOSIS — N400 Enlarged prostate without lower urinary tract symptoms: Secondary | ICD-10-CM | POA: Diagnosis present

## 2019-06-23 DIAGNOSIS — K7460 Unspecified cirrhosis of liver: Secondary | ICD-10-CM | POA: Diagnosis present

## 2019-06-23 DIAGNOSIS — Z91018 Allergy to other foods: Secondary | ICD-10-CM

## 2019-06-23 DIAGNOSIS — Z981 Arthrodesis status: Secondary | ICD-10-CM

## 2019-06-23 DIAGNOSIS — E119 Type 2 diabetes mellitus without complications: Secondary | ICD-10-CM

## 2019-06-23 DIAGNOSIS — R112 Nausea with vomiting, unspecified: Secondary | ICD-10-CM | POA: Diagnosis not present

## 2019-06-23 DIAGNOSIS — Z794 Long term (current) use of insulin: Secondary | ICD-10-CM | POA: Diagnosis not present

## 2019-06-23 DIAGNOSIS — N179 Acute kidney failure, unspecified: Secondary | ICD-10-CM | POA: Insufficient documentation

## 2019-06-23 LAB — COMPREHENSIVE METABOLIC PANEL
ALT: 21 U/L (ref 0–44)
AST: 21 U/L (ref 15–41)
Albumin: 3.8 g/dL (ref 3.5–5.0)
Alkaline Phosphatase: 81 U/L (ref 38–126)
Anion gap: 9 (ref 5–15)
BUN: 29 mg/dL — ABNORMAL HIGH (ref 8–23)
CO2: 23 mmol/L (ref 22–32)
Calcium: 8.9 mg/dL (ref 8.9–10.3)
Chloride: 104 mmol/L (ref 98–111)
Creatinine, Ser: 2.11 mg/dL — ABNORMAL HIGH (ref 0.61–1.24)
GFR calc Af Amer: 37 mL/min — ABNORMAL LOW (ref >60)
GFR calc non Af Amer: 32 mL/min — ABNORMAL LOW (ref >60)
Glucose, Bld: 142 mg/dL — ABNORMAL HIGH (ref 70–99)
Potassium: 4.3 mmol/L (ref 3.5–5.1)
Sodium: 136 mmol/L (ref 135–145)
Total Bilirubin: 0.5 mg/dL (ref 0.3–1.2)
Total Protein: 7.6 g/dL (ref 6.5–8.1)

## 2019-06-23 LAB — URINALYSIS, ROUTINE W REFLEX MICROSCOPIC
Bacteria, UA: NONE SEEN
Bilirubin Urine: NEGATIVE
Glucose, UA: NEGATIVE mg/dL
Hgb urine dipstick: NEGATIVE
Ketones, ur: NEGATIVE mg/dL
Leukocytes,Ua: NEGATIVE
Nitrite: NEGATIVE
Protein, ur: 100 mg/dL — AB
Specific Gravity, Urine: 1.019 (ref 1.005–1.030)
pH: 5 (ref 5.0–8.0)

## 2019-06-23 LAB — CBC
HCT: 37.1 % — ABNORMAL LOW (ref 39.0–52.0)
Hemoglobin: 12.1 g/dL — ABNORMAL LOW (ref 13.0–17.0)
MCH: 30 pg (ref 26.0–34.0)
MCHC: 32.6 g/dL (ref 30.0–36.0)
MCV: 91.8 fL (ref 80.0–100.0)
Platelets: 431 10*3/uL — ABNORMAL HIGH (ref 150–400)
RBC: 4.04 MIL/uL — ABNORMAL LOW (ref 4.22–5.81)
RDW: 13 % (ref 11.5–15.5)
WBC: 8.8 10*3/uL (ref 4.0–10.5)
nRBC: 0 % (ref 0.0–0.2)

## 2019-06-23 LAB — LIPASE, BLOOD: Lipase: 25 U/L (ref 11–51)

## 2019-06-23 LAB — CBG MONITORING, ED: Glucose-Capillary: 102 mg/dL — ABNORMAL HIGH (ref 70–99)

## 2019-06-23 LAB — SARS CORONAVIRUS 2 BY RT PCR (HOSPITAL ORDER, PERFORMED IN ~~LOC~~ HOSPITAL LAB): SARS Coronavirus 2: NEGATIVE

## 2019-06-23 MED ORDER — ENOXAPARIN SODIUM 40 MG/0.4ML ~~LOC~~ SOLN
40.0000 mg | SUBCUTANEOUS | Status: DC
  Administered 2019-06-23 – 2019-06-24 (×2): 40 mg via SUBCUTANEOUS
  Filled 2019-06-23 (×2): qty 0.4

## 2019-06-23 MED ORDER — HYDRALAZINE HCL 25 MG PO TABS
50.0000 mg | ORAL_TABLET | Freq: Three times a day (TID) | ORAL | Status: DC
  Administered 2019-06-23 – 2019-06-25 (×5): 50 mg via ORAL
  Filled 2019-06-23 (×5): qty 2

## 2019-06-23 MED ORDER — ONDANSETRON HCL 4 MG/2ML IJ SOLN: 4.0000 mg | Freq: Four times a day (QID) | INTRAMUSCULAR | Status: DC | PRN

## 2019-06-23 MED ORDER — CARVEDILOL 12.5 MG PO TABS
25.0000 mg | ORAL_TABLET | Freq: Two times a day (BID) | ORAL | Status: DC
  Administered 2019-06-24 – 2019-06-25 (×2): 25 mg via ORAL
  Filled 2019-06-23 (×4): qty 2

## 2019-06-23 MED ORDER — INSULIN ASPART 100 UNIT/ML ~~LOC~~ SOLN: 0.0000 [IU] | SUBCUTANEOUS | Status: DC

## 2019-06-23 MED ORDER — IOHEXOL 300 MG/ML  SOLN
75.0000 mL | Freq: Once | INTRAMUSCULAR | Status: AC | PRN
  Administered 2019-06-23: 75 mL via INTRAVENOUS

## 2019-06-23 MED ORDER — SODIUM CHLORIDE 0.9 % IV BOLUS
1000.0000 mL | Freq: Once | INTRAVENOUS | Status: AC
  Administered 2019-06-23: 1000 mL via INTRAVENOUS

## 2019-06-23 MED ORDER — HYDROMORPHONE HCL 1 MG/ML IJ SOLN: 0.5000 mg | INTRAMUSCULAR | Status: DC | PRN

## 2019-06-23 MED ORDER — SODIUM CHLORIDE 0.45 % IV SOLN: INTRAVENOUS | Status: AC

## 2019-06-23 NOTE — Consult Note (Signed)
Ophthalmology [facility] Consult  Reason for Consult: pSBO  Referring Physician:  Evalee Jefferson, PA (ED)   Chief Complaint    Abdominal Pain      HPI: Jim Sullivan is a 64 y.o. male with a history of a Ex lap for cycles of abdominal pain and nausea/vomiting that was found to have a Meckel's diverticulum. He underwent a SBR for this in 2013 in [place]. Since that time he has had these periodic episodes of 1-2 days of pain, nausea, vomiting and diarrhea like a GI bug, and says that he started to have this about 5-6 days ago. This episode has been the longest. He has had some diarrhea but has not had any BM since yesterday. He is no longer nauseated and his abdomen is feeling some what better.  He was evaluated in the Ed due to the chronicity and inability to tolerate Po as every time he took in liquids he gets distended and has pain. CT scan reveals an anastomosis in the RUQ that appears open but some scarring around it that looks like adhesion causing a pSBO.   Past Medical History:  Diagnosis Date  . Diabetes mellitus without complication ([place])   . Erectile dysfunction   . GERD (gastroesophageal reflux disease)   . Hyperlipidemia   . Hypertension     Past Surgical History:  Procedure Laterality Date  . HERNIA REPAIR Right 2019  . HERNIA REPAIR Left infancy  . SMALL INTESTINE SURGERY  2013   Meckels Diverticulum     History reviewed. No pertinent family history.  Social History   Tobacco Use  . Smoking status: Former Smoker    Packs/day: 0.50    Years: 30.00    Pack years: 15.00    Types: Cigarettes    Quit date: 01/16/2013    Years since quitting: 6.4  . Smokeless tobacco: Never Used  Substance Use Topics  . Alcohol use: Not on file  . Drug use: Not on file    Medications: I have reviewed the patient's current medications. Current Facility-Administered Medications  Medication Dose Route Frequency Provider Last Rate Last Admin  . 0.45 % sodium chloride infusion    Intravenous Continuous Roney Jaffe, MD 125 mL/hr at 06/23/19 2023 New Bag at 06/23/19 2023  . HYDROmorphone (DILAUDID) injection 0.5-1 mg  0.5-1 mg Intravenous Q4H PRN Roney Jaffe, MD      . insulin aspart (novoLOG) injection 0-9 Units  0-9 Units Subcutaneous Q4H Roney Jaffe, MD      . ondansetron [facility]) injection 4-8 mg  4-8 mg Intravenous Q6H PRN Roney Jaffe, MD       Current Outpatient Medications  Medication Sig Dispense Refill Last Dose  . aspirin EC 81 MG tablet Take 81 mg by mouth every evening.    06/22/2019 at Unknown time  . atorvastatin (LIPITOR) 20 MG tablet Take 1 tablet (20 mg total) by mouth daily. 90 tablet 1 06/23/2019 at Unknown time  . carvedilol (COREG) 25 MG tablet Take 1 tablet (25 mg total) by mouth 2 (two) times daily with a meal. 180 tablet 1 06/23/2019 at [place]  . hydrALAZINE (APRESOLINE) 50 MG tablet Take 1 tablet (50 mg total) by mouth 3 (three) times daily. 270 tablet 1 06/23/2019 at [place]  . metFORMIN (GLUCOPHAGE-XR) 500 MG 24 hr tablet Take 2 tablets (1,000 mg total) by mouth daily with breakfast. 180 tablet 1 06/23/2019 at Unknown time  . sitaGLIPtin (JANUVIA) 100 MG tablet Take 1 tablet (100 mg total) by mouth daily. (  Patient taking differently: Take 100 mg by mouth every evening. ) 30 tablet 2 06/22/2019 at Unknown time  . spironolactone (ALDACTONE) 25 MG tablet Take 1 tablet (25 mg total) by mouth daily. 90 tablet 1 06/23/2019 at Unknown time  . terazosin (HYTRIN) 5 MG capsule Take 1 capsule (5 mg total) by mouth at bedtime. 90 capsule 1 06/22/2019 at Unknown time  . Vitamin D, Ergocalciferol, (DRISDOL) 1.25 MG (50000 UNIT) CAPS capsule Take 1 capsule (50,000 Units total) by mouth every 7 (seven) days. 12 capsule 1 06/21/2019 at Unknown time    Allergies  Allergen Reactions  . Morphine And Related Itching    Skin crawling     ROS:  A comprehensive review of systems was negative except for: Gastrointestinal: positive for abdominal pain, nausea and  vomiting Diarrhea prior   Blood pressure (!) 166/73, pulse 64, temperature 98.7 F (37.1 C), temperature source Oral, resp. rate 20, height 5\' 9"  (1.753 m), weight 99.8 kg, SpO2 99 %. Physical Exam Vitals reviewed.  Constitutional:      Appearance: He is normal weight.  HENT:     Head: Normocephalic.  Eyes:     Extraocular Movements: Extraocular movements intact.  Cardiovascular:     Rate and Rhythm: Normal rate.  Pulmonary:     Effort: Pulmonary effort is normal.  Abdominal:     General: There is no distension.     Palpations: Abdomen is soft.     Tenderness: There is generalized abdominal tenderness.     Hernia: No hernia is present.     Comments: Minimal tenderness throughout   Musculoskeletal:     Comments: Moves all extremities   Skin:    General: Skin is warm and dry.  Neurological:     General: No focal deficit present.     Mental Status: He is alert and oriented to person, place, and time.  Psychiatric:        Mood and Affect: Mood normal.        Behavior: Behavior normal.     Results: Results for orders placed or performed during the hospital encounter of 06/23/19 (from the past 48 hour(s))  Lipase, blood     Status: None   Collection Time: 06/23/19 12:29 PM  Result Value Ref Range   Lipase 25 11 - 51 U/L    Comment: Performed at [facility], [address], [place], [place] [address]  Comprehensive metabolic panel     Status: Abnormal   Collection Time: 06/23/19 12:29 PM  Result Value Ref Range   Sodium 136 135 - 145 mmol/L   Potassium 4.3 3.5 - 5.1 mmol/L   Chloride 104 98 - 111 mmol/L   CO2 23 22 - 32 mmol/L   Glucose, Bld 142 (H) 70 - 99 mg/dL    Comment: Glucose reference range applies only to samples taken after fasting for at least 8 hours.   BUN 29 (H) 8 - 23 mg/dL   Creatinine, Ser 2.11 (H) 0.61 - 1.24 mg/dL   Calcium 8.9 8.9 - 10.3 mg/dL   Total Protein 7.6 6.5 - 8.1 g/dL   Albumin 3.8 3.5 - 5.0 g/dL   AST 21 15 - 41 U/L   ALT 21 0 - 44 U/L    Alkaline Phosphatase 81 38 - 126 U/L   Total Bilirubin 0.5 0.3 - 1.2 mg/dL   GFR calc non Af Amer 32 (L) >60 mL/min   GFR calc Af Amer 37 (L) >60 mL/min   Anion gap  9 5 - 15    Comment: Performed at [facility], [address], [place], [place] [address]  CBC     Status: Abnormal   Collection Time: 06/23/19 12:29 PM  Result Value Ref Range   WBC 8.8 4.0 - 10.5 K/uL   RBC 4.04 (L) 4.22 - 5.81 MIL/uL   Hemoglobin 12.1 (L) 13.0 - 17.0 g/dL   HCT 37.1 (L) 39.0 - 52.0 %   MCV 91.8 80.0 - 100.0 fL   MCH 30.0 26.0 - 34.0 pg   MCHC 32.6 30.0 - 36.0 g/dL   RDW 13.0 11.5 - 15.5 %   Platelets 431 (H) 150 - 400 K/uL   nRBC 0.0 0.0 - 0.2 %    Comment: Performed at [facility], [address] [place], [place] [address]  Urinalysis, Routine w reflex microscopic     Status: Abnormal   Collection Time: 06/23/19  1:50 PM  Result Value Ref Range   Color, Urine YELLOW YELLOW   APPearance CLEAR CLEAR   Specific Gravity, Urine 1.019 1.005 - 1.030   pH 5.0 5.0 - 8.0   Glucose, UA NEGATIVE NEGATIVE mg/dL   Hgb urine dipstick NEGATIVE NEGATIVE   Bilirubin Urine NEGATIVE NEGATIVE   Ketones, ur NEGATIVE NEGATIVE mg/dL   Protein, ur 100 (A) NEGATIVE mg/dL   Nitrite NEGATIVE NEGATIVE   Leukocytes,Ua NEGATIVE NEGATIVE   WBC, UA 0-5 0 - 5 WBC/hpf   Bacteria, UA NONE SEEN NONE SEEN   Squamous Epithelial / LPF 0-5 0 - 5   Mucus PRESENT     Comment: Performed at [facility], [address], [place], [place] [address]   Personally reviewed -side to side anastomosis looks patent, some tethering distal looking like pSBO site, dilated bowel proximal   CT ABDOMEN PELVIS W CONTRAST  Result Date: 06/23/2019 CLINICAL DATA:  Abdominal pain, vomiting, and diarrhea for 5 days. EXAM: CT ABDOMEN AND PELVIS WITH CONTRAST TECHNIQUE: Multidetector CT imaging of the abdomen and pelvis was performed using the standard protocol following bolus administration of intravenous contrast. CONTRAST:  52mL OMNIPAQUE IOHEXOL 300  MG/ML  SOLN COMPARISON:  None. FINDINGS: Lower Chest: No acute findings. Hepatobiliary: No hepatic masses identified. Gallbladder is unremarkable. No evidence of biliary ductal dilatation. Pancreas:  No mass or inflammatory changes. Spleen: Within normal limits in size and appearance. Adrenals/Urinary Tract: No masses identified. No evidence of ureteral calculi or hydronephrosis. Stomach/Bowel: Mild wall thickening and dilatation is seen involving multiple small bowel loops in the mid abdomen and upper pelvis. There is sparing of the distal ileum. A transition point is seen the anterior right abdomen at the site of small bowel surgical staples, suggesting a partial small bowel obstruction. Diverticulosis is seen mainly involving the sigmoid colon, however there is no evidence of diverticulitis. Vascular/Lymphatic: No pathologically enlarged lymph nodes. No abdominal aortic aneurysm. Aortic atherosclerosis noted. Reproductive:  No mass or other significant abnormality. Other:  None. Musculoskeletal:  No suspicious bone lesions identified. IMPRESSION: 1. Mild small bowel dilatation and wall thickening, with transition point at site of surgical staples in anterior right abdomen. This is suspicious for enteritis and partial small bowel obstruction. 2. Sigmoid diverticulosis, without radiographic evidence of diverticulitis. Aortic Atherosclerosis (ICD10-I70.0). Electronically Signed   By: Marlaine Hind M.D.   On: 06/23/2019 16:25     Assessment & Plan:  Jim Sullivan is a 64 y.o. male with pSBO. He is feeling better and has had some fluids.  -Keep NPO for now, likely will do SBFT tomorrow  to ensure resolved  -Will review CT with radiology to ensure anastomosis looks patent  -Will avoid NG right now as stomach is decompressed on CT and no vomiting   All questions were answered to the satisfaction of the patient and family.   Jim Sullivan 06/23/2019, 7:34 PM

## 2019-06-23 NOTE — ED Triage Notes (Signed)
Pt with abd pain with N/V/D for past 5 days.  Had chicken noodle soup yesterday.  No emesis today, last 3 days ago.  diarrhea x 1 this morning.

## 2019-06-23 NOTE — ED Provider Notes (Signed)
[facility] EMERGENCY DEPARTMENT Provider Note   CSN: [id number] Arrival date & time: 06/23/19  1153     History Chief Complaint  Patient presents with  . Abdominal Pain    Jim Sullivan is a 64 y.o. male with a history of well-controlled diabetes, GERD, hypertension and hyperlipidemia, surgical history significant for small bowel surgery secondary to Meckel's diverticulum and an inguinal hernia repair presenting with a 6-day history of nausea, vomiting and abdominal pain with intermittent episodes of abdominal distention which is worsened with any attempts at p.o. intake.  His symptoms started with n/v and nonbloody diarrhea after a trip to [place], initially felt he had a GI virus, but he continues to have worsening episodic abdominal pain and distention which worsens with any attempt at PO intake.  He is passing gas, has had no diarrhea since yesterday after eating broth.  Denies fevers or chills.  Reports has similar episodes about once a year but generally lasts a few days and resolves without intervention.   The history is provided by the patient.       Past Medical History:  Diagnosis Date  . Diabetes mellitus without complication ([place])   . Erectile dysfunction   . GERD (gastroesophageal reflux disease)   . Hyperlipidemia   . Hypertension     Patient Active Problem List   Diagnosis Date Noted  . Hand deformity 06/03/2019  . Essential hypertension 02/25/2018  . Type 2 diabetes mellitus without complication, without long-term current use of insulin ([place]) 02/25/2018  . Hyperlipidemia 02/25/2018    Past Surgical History:  Procedure Laterality Date  . HERNIA REPAIR Right 2019  . HERNIA REPAIR Left infancy  . SMALL INTESTINE SURGERY  2013   Meckels Diverticulum        History reviewed. No pertinent family history.  Social History   Tobacco Use  . Smoking status: Former Smoker    Packs/day: 0.50    Years: 30.00    Pack years: 15.00    Types: Cigarettes     Quit date: 01/16/2013    Years since quitting: 6.4  . Smokeless tobacco: Never Used  Substance Use Topics  . Alcohol use: Not on file  . Drug use: Not on file    Home Medications Prior to Admission medications   Medication Sig Start Date End Date Taking? Authorizing Provider  aspirin EC 81 MG tablet Take 81 mg by mouth daily.    [provider]  atorvastatin (LIPITOR) 20 MG tablet Take 1 tablet (20 mg total) by mouth daily. 05/22/19   Claretta Fraise, MD  blood glucose meter kit and supplies Dispense based on patient and insurance preference. Use BID and PRN to check blood sugar. Dx E11.9 09/10/18   Claretta Fraise, MD  carvedilol (COREG) 25 MG tablet Take 1 tablet (25 mg total) by mouth 2 (two) times daily with a meal. 05/22/19   Claretta Fraise, MD  hydrALAZINE (APRESOLINE) 50 MG tablet Take 1 tablet (50 mg total) by mouth 3 (three) times daily. 05/22/19   Claretta Fraise, MD  metFORMIN (GLUCOPHAGE-XR) 500 MG 24 hr tablet Take 2 tablets (1,000 mg total) by mouth daily with breakfast. 05/22/19   Claretta Fraise, MD  sitaGLIPtin (JANUVIA) 100 MG tablet Take 1 tablet (100 mg total) by mouth daily. 05/22/19   Claretta Fraise, MD  spironolactone (ALDACTONE) 25 MG tablet Take 1 tablet (25 mg total) by mouth daily. 05/22/19   Claretta Fraise, MD  terazosin (HYTRIN) 5 MG capsule Take 1 capsule (5 mg  total) by mouth at bedtime. 05/22/19   Claretta Fraise, MD  Vitamin D, Ergocalciferol, (DRISDOL) 1.25 MG (50000 UNIT) CAPS capsule Take 1 capsule (50,000 Units total) by mouth every 7 (seven) days. 05/22/19   Claretta Fraise, MD    Allergies    Morphine and related  Review of Systems   Review of Systems  Constitutional: Positive for appetite change and fatigue. Negative for chills and fever.  HENT: Negative for congestion and sore throat.   Eyes: Negative.   Respiratory: Negative for chest tightness and shortness of breath.   Cardiovascular: Negative for chest pain.  Gastrointestinal: Positive for abdominal  distention, abdominal pain, diarrhea, nausea and vomiting.  Genitourinary: Negative.   Musculoskeletal: Negative for arthralgias, joint swelling and neck pain.  Skin: Negative.  Negative for rash and wound.  Neurological: Negative for dizziness, weakness, light-headedness, numbness and headaches.  Psychiatric/Behavioral: Negative.     Physical Exam Updated Vital Signs BP (!) 166/73   Pulse 64   Temp 98.7 F (37.1 C) (Oral)   Resp 20   Ht 5' 9"  (1.753 m)   Wt 99.8 kg   SpO2 99%   BMI 32.49 kg/m   Physical Exam Vitals and nursing note reviewed.  Constitutional:      Appearance: He is well-developed.  HENT:     Head: Normocephalic and atraumatic.  Eyes:     Conjunctiva/sclera: Conjunctivae normal.  Cardiovascular:     Rate and Rhythm: Normal rate and regular rhythm.     Heart sounds: Normal heart sounds.  Pulmonary:     Effort: Pulmonary effort is normal.     Breath sounds: Normal breath sounds. No wheezing.  Abdominal:     General: Bowel sounds are increased. There is distension.     Palpations: Abdomen is soft.     Tenderness: There is generalized abdominal tenderness.     Comments: Increased tenderness in bilateral upper quadrants with increased tympany to percussion.   Musculoskeletal:        General: Normal range of motion.     Cervical back: Normal range of motion.  Skin:    General: Skin is warm and dry.  Neurological:     Mental Status: He is alert.     ED Results / Procedures / Treatments   Labs (all labs ordered are listed, but only abnormal results are displayed) Labs Reviewed  COMPREHENSIVE METABOLIC PANEL - Abnormal; Notable for the following components:      Result Value   Glucose, Bld 142 (*)    BUN 29 (*)    Creatinine, Ser 2.11 (*)    GFR calc non Af Amer 32 (*)    GFR calc Af Amer 37 (*)    All other components within normal limits  CBC - Abnormal; Notable for the following components:   RBC 4.04 (*)    Hemoglobin 12.1 (*)    HCT 37.1  (*)    Platelets 431 (*)    All other components within normal limits  URINALYSIS, ROUTINE W REFLEX MICROSCOPIC - Abnormal; Notable for the following components:   Protein, ur 100 (*)    All other components within normal limits  SARS CORONAVIRUS 2 BY RT PCR (HOSPITAL ORDER, [place] [facility])  LIPASE, BLOOD    EKG None  Radiology CT ABDOMEN PELVIS W CONTRAST  Result Date: 06/23/2019 CLINICAL DATA:  Abdominal pain, vomiting, and diarrhea for 5 days. EXAM: CT ABDOMEN AND PELVIS WITH CONTRAST TECHNIQUE: Multidetector CT imaging of the abdomen and  pelvis was performed using the standard protocol following bolus administration of intravenous contrast. CONTRAST:  63m OMNIPAQUE IOHEXOL 300 MG/ML  SOLN COMPARISON:  None. FINDINGS: Lower Chest: No acute findings. Hepatobiliary: No hepatic masses identified. Gallbladder is unremarkable. No evidence of biliary ductal dilatation. Pancreas:  No mass or inflammatory changes. Spleen: Within normal limits in size and appearance. Adrenals/Urinary Tract: No masses identified. No evidence of ureteral calculi or hydronephrosis. Stomach/Bowel: Mild wall thickening and dilatation is seen involving multiple small bowel loops in the mid abdomen and upper pelvis. There is sparing of the distal ileum. A transition point is seen the anterior right abdomen at the site of small bowel surgical staples, suggesting a partial small bowel obstruction. Diverticulosis is seen mainly involving the sigmoid colon, however there is no evidence of diverticulitis. Vascular/Lymphatic: No pathologically enlarged lymph nodes. No abdominal aortic aneurysm. Aortic atherosclerosis noted. Reproductive:  No mass or other significant abnormality. Other:  None. Musculoskeletal:  No suspicious bone lesions identified. IMPRESSION: 1. Mild small bowel dilatation and wall thickening, with transition point at site of surgical staples in anterior right abdomen. This is suspicious for  enteritis and partial small bowel obstruction. 2. Sigmoid diverticulosis, without radiographic evidence of diverticulitis. Aortic Atherosclerosis (ICD10-I70.0). Electronically Signed   By: JMarlaine HindM.D.   On: 06/23/2019 16:25    Procedures Procedures (including critical care time)  Medications Ordered in ED Medications  sodium chloride 0.9 % bolus 1,000 mL (1,000 mLs Intravenous New Bag/Given 06/23/19 1548)  iohexol (OMNIPAQUE) 300 MG/ML solution 75 mL (75 mLs Intravenous Contrast Given 06/23/19 1554)    ED Course  I have reviewed the triage vital signs and the nursing notes.  Pertinent labs & imaging results that were available during my care of the patient were reviewed by me and considered in my medical decision making (see chart for details).    MDM Rules/Calculators/A&P                      Call to Dr. BConstance Hawwho will follow patient.  Recommends NG tube if pt will allow for quicker resolution.  Discussed with patient, agreeable to admission but feels better after getting IV fluids, worried about trying to eat.  Defers NG tube at this time.    Hospitalist admission.  Spoke with Dr. SJonnie Finnerwho accepts pt for admission.  Final Clinical Impression(s) / ED Diagnoses Final diagnoses:  S[place] obstruction) [place]  Renal insufficiency  Dehydration    Rx / DC Orders ED Discharge Orders    None       ILandis Martins06/07/21 1830    ZFredia Sorrow MD 07/07/19 2329

## 2019-06-23 NOTE — H&P (Deleted)
[facility] History & Physical  Rob Doctor, hospital MD  Hamp Moreland 06/23/2019  Chief Complaint: Abd pain w/ nausea and vomiting HPI: The patient is a 64 y.o. year-old w/ hx of HTN, HL, DM2 on oral agents and h/o Meckel's diverticulum surgery presenting w/ nausea/ vomiting / diarrhea for the last 5 days. Having abd pains off and on as well.  Hasn't vomited the last 2 days and last diarrhea yesterday, but not eating now and says if he was trying to eat he would still be having diarrhea. W/u in ED showed no fevers, normal vital signs and labs show normal electrolytes, ^creat 2.1 (baseline 1.7) , normal LFT"S / albumin, WBC normal 8K and Hb 12 and uA negative.  CT abd w/ contrast showing SB dilatation and wall thickening w/ possible transition point, suspecting enteritis vs partial SBO.  Gen surg Dr Constance Haw was called , they recommended NG tube but pt refused. Asked to see for admission.   Pt states he got sick after a meal and had fevers and chills initially 5 days ago.  Since then abdomen has been "growling" and can't keep anything down , also having watery diarrhea after attempts to eat.  H/o Meckel's diverticulum surgery in the past. Also had hernia repair as a child and again as an adult.  Taking medication for HTN and HL and po meds for DM2.    ROS  denies CP  no joint pain   no HA  no blurry vision  no rash  no dysuria  no difficulty voiding  no change in urine color    Past Medical History  Past Medical History:  Diagnosis Date  . Allergy   . Arthritis    neck  . Cataract    bilateral - MD monitoring cataracts  . CHF (congestive heart failure) ([place])   . Chronic kidney disease, stage I    DR OTTELIN  HX UTIS  . Cirrhosis ([place])   . Cramp of limb   . Diabetes mellitus   . Dysphagia, unspecified(787.20)   . Dysuria   . Epistaxis   . GERD (gastroesophageal reflux disease)   . Heart murmur    NO CARDIOLOGIST  DX FOR YEARS ASYMPTOMATIC  . Lumbago   . Neoplasm of  uncertain behavior of skin   . Nonspecific elevation of levels of transaminase or lactic acid dehydrogenase (LDH)   . Osteoarthrosis, unspecified whether generalized or localized, unspecified site   . Other and unspecified hyperlipidemia    diet controlled  . Pain in joint, shoulder region   . Paresthesias 12/09/2014  . Postablative ovarian failure   . Trochanteric bursitis of left hip 08/24/2015  . Type 2 diabetes mellitus without complication ([place])   . Unspecified essential hypertension    no meds   Past Surgical History  Past Surgical History:  Procedure Laterality Date  . BREAST BIOPSY    . CARDIAC CATHETERIZATION N/A 10/06/2015   Procedure: Left Heart Cath and Coronary Angiography;  Surgeon: Belva Crome, MD;  Location: [place] [facility];  Service: Cardiovascular;  Laterality: N/A;  . COLONOSCOPY  2012   Dr Lajoyce Corners.   . COLONOSCOPY WITH PROPOFOL N/A 03/16/2016   Procedure: COLONOSCOPY WITH PROPOFOL;  Surgeon: Milus Banister, MD;  Location: [facility];  Service: Endoscopy;  Laterality: N/A;  . CORONARY ARTERY BYPASS GRAFT N/A 10/07/2015   Procedure: CORONARY ARTERY BYPASS GRAFTING (CABG) x 3 USING RIGHT LEG GREATER SAPHENOUS VEIN GRAFT;  Surgeon: Melrose Nakayama, MD;  Location:  [place] OR;  Service: Open Heart Surgery;  Laterality: N/A;  . ENDOVEIN HARVEST OF GREATER SAPHENOUS VEIN Right 10/07/2015   Procedure: ENDOVEIN HARVEST OF GREATER SAPHENOUS VEIN;  Surgeon: Melrose Nakayama, MD;  Location: [place];  Service: Open Heart Surgery;  Laterality: Right;  . ESOPHAGEAL BANDING  12/05/2018   Procedure: ESOPHAGEAL BANDING;  Surgeon: Milus Banister, MD;  Location: [facility] ENDOSCOPY;  Service: Endoscopy;;  . ESOPHAGEAL BANDING  12/15/2018   Procedure: ESOPHAGEAL BANDING;  Surgeon: Juanita Craver, MD;  Location[facility] ENDOSCOPY;  Service: Endoscopy;;  . ESOPHAGOGASTRODUODENOSCOPY N/A 12/15/2018   Procedure: ESOPHAGOGASTRODUODENOSCOPY (EGD);  Surgeon: Juanita Craver, MD;  Location: [facility] ENDOSCOPY;   Service: Endoscopy;  Laterality: N/A;  . ESOPHAGOGASTRODUODENOSCOPY (EGD) WITH PROPOFOL N/A 03/16/2016   Procedure: ESOPHAGOGASTRODUODENOSCOPY (EGD) WITH PROPOFOL;  Surgeon: Milus Banister, MD;  Location: [facility]OS[facility];  Service: Endoscopy;  Laterality: N/A;  . ESOPHAGOGASTRODUODENOSCOPY (EGD) WITH PROPOFOL N/A 12/05/2018   Procedure: ESOPHAGOGASTRODUODENOSCOPY (EGD) WITH PROPOFOL;  Surgeon: Milus Banister, MD;  Location: [facility]OS[facility];  Service: Endoscopy;  Laterality: N/A;  . HEMOSTASIS CLIP PLACEMENT  12/15/2018   Procedure: HEMOSTASIS CLIP PLACEMENT;  Surgeon: Juanita Craver, MD;  Location: [place] ENDOSCOPY;  Service: Endoscopy;;  . IR ANGIOGRAM SELECTIVE EACH ADDITIONAL VESSEL  12/16/2018  . IR EMBO ART  VEN HEMORR LYMPH EXTRAV  INC GUIDE ROADMAPPING  12/16/2018  . IR PARACENTESIS  12/16/2018  . IR TIPS  12/16/2018  . MAXIMUM ACCESS (MAS)POSTERIOR LUMBAR INTERBODY FUSION (PLIF) 1 LEVEL Left 02/17/2015   Procedure: FOR MAXIMUM ACCESS (MAS) POSTERIOR LUMBAR INTERBODY FUSION (PLIF) LUMBAR THREE-FOUR EXTRAFORAMINAL MICRODISCECTOMY LUMBAR FIVE-SACRAL ONE LEFT;  Surgeon: Eustace Moore, MD;  Location: [place] NEURO ORS;  Service: Neurosurgery;  Laterality: Left;  . RADIOLOGY WITH ANESTHESIA N/A 12/16/2018   Procedure: RADIOLOGY WITH ANESTHESIA;  Surgeon: Radiologist, Medication, MD;  Location: [place];  Service: Radiology;  Laterality: N/A;  . SCLEROTHERAPY  12/15/2018   Procedure: SCLEROTHERAPY;  Surgeon: Juanita Craver, MD;  Location: [facility] ENDOSCOPY;  Service: Endoscopy;;  . TEE WITHOUT CARDIOVERSION N/A 10/07/2015   Procedure: TRANSESOPHAGEAL ECHOCARDIOGRAM (TEE);  Surgeon: Melrose Nakayama, MD;  Location: [place];  Service: Open Heart Surgery;  Laterality: N/A;  . TUBAL LIGATION  1982   Dr Connye Burkitt  . UPPER GASTROINTESTINAL ENDOSCOPY    . VAGINAL HYSTERECTOMY  1997   Dr Rande Lawman   Family History  Family History  Problem Relation Age of Onset  . Lung cancer Father   . Arthritis Sister   . Arthritis Brother    . Heart disease Maternal Grandmother   . Heart disease Maternal Grandfather   . Heart disease Paternal Grandmother   . Heart disease Paternal Grandfather   . Breast cancer Mother   . Liver cancer Brother   . Breast cancer Maternal Aunt   . Breast cancer Paternal Aunt   . Colon cancer Neg Hx   . Esophageal cancer Neg Hx   . Rectal cancer Neg Hx   . Stomach cancer Neg Hx    Social History  reports that she has never smoked. She has never used smokeless tobacco. She reports that she does not drink alcohol or use drugs. Allergies  Allergies  Allergen Reactions  . Kiwi Extract Anaphylaxis  . Tdap [Tetanus-Diphth-Acell Pertussis] Swelling and Other (See Comments)    Swelling at injection site, gets very hot  . Statins     RHABDOMYOLYSIS  . Latex Itching, Dermatitis and Rash  . Tramadol Nausea And Vomiting   Home medications Prior to  Admission medications   Medication Sig Start Date End Date Taking? Authorizing Provider  acetaminophen (TYLENOL) 500 MG tablet Take 500 mg by mouth at bedtime.     [provider]  Aromatic Inhalants (VICKS VAPOR IN) Vicks Vapor Rub apply small amount to outside of nose to help breathing    [provider]  BD PEN NEEDLE NANO U/F 32G X 4 MM MISC USE THREE TIMES DAILY AS DIRECTED 08/05/18   Reed, Tiffany L, DO  Biotin 10000 MCG TABS Take 10,000 mcg by mouth every morning.    [provider]  bisacodyl (DULCOLAX) 10 MG suppository Place 1 suppository (10 mg total) rectally daily as needed for moderate constipation. 12/20/18   Aline August, MD  calcium carbonate (OS-CAL) 600 MG TABS Take 600 mg by mouth 2 (two) times daily with a meal.      [provider]  Cholecalciferol (VITAMIN D) 50 MCG (2000 UT) CAPS Take 2,000 Units by mouth daily.     [provider]  Cyanocobalamin (VITAMIN B 12 PO) Take 1,000 mcg by mouth daily.      [provider]  ezetimibe (ZETIA) 10 MG tablet TAKE 1 TABLET(10 MG) BY MOUTH  DAILY 09/04/18   Reed, Tiffany L, DO  furosemide (LASIX) 40 MG tablet Take 40 mg by mouth daily.     [provider]  glucose blood test strip One Touch Ultra II strips. Use to test blood sugar three times daily. Dx: E11.65 02/08/17   Reed, Tiffany L, DO  insulin detemir (LEVEMIR) 100 UNIT/ML injection Inject 0.2 mLs (20 Units total) into the skin at bedtime. 12/21/18   Aline August, MD  Insulin Syringe-Needle U-100 (INSULIN SYRINGE 1CC/31GX5/16") 31G X 5/16" 1 ML MISC USE AS DIRECTED DAILY WITH LEVEMIR 04/05/18   Reed, Tiffany L, DO  JARDIANCE 25 MG TABS tablet Take 25 mg by mouth daily. 01/07/19   [provider]  lactulose (CHRONULAC) 10 GM/15ML solution Take 20 g by mouth 3 (three) times daily. 01/11/19   [provider]  loratadine (CLARITIN) 10 MG tablet Take 10 mg by mouth daily as needed for allergies.    [provider]  MAGNESIUM PO Take 500 mg by mouth 2 (two) times daily in the am and at bedtime..    [provider]  Multiple Vitamins-Minerals (MULTIVITAMIN WITH MINERALS) tablet Take 1 tablet by mouth daily.      [provider]  NOVOLOG FLEXPEN 100 UNIT/ML FlexPen Inject 12 units under the skin every morning, 8 units at lunch and 12 units at supper 10/29/18   Reed, Tiffany L, DO  ondansetron (ZOFRAN) 4 MG tablet Take 1 tablet (4 mg total) by mouth every 6 (six) hours as needed for nausea. 12/20/18   Aline August, MD  pantoprazole (PROTONIX) 40 MG tablet Take 1 tablet (40 mg total) by mouth 2 (two) times daily. 12/20/18   Aline August, MD  Polyethyl Glycol-Propyl Glycol (SYSTANE OP) Place 1 drop into both eyes 2 (two) times daily.    [provider]  Probiotic Product (PROBIOTIC DAILY PO) Take 1 capsule by mouth daily. Digestive Advantage Probiotic    [provider]  spironolactone (ALDACTONE) 50 MG tablet Take 1 tablet (50 mg total) by mouth 2 (two) times daily. 12/23/18   Gayland Curry, DO   Liver Function  Tests Recent Labs  Lab 01/20/19 1436 01/22/19 1042  AST 58* 62*  ALT 41* 45*  ALKPHOS  --  127*  BILITOT 3.5* 3.4*  PROT 7.8 7.2  ALBUMIN  --  3.0*   Recent Labs  Lab 01/22/19 1042  LIPASE 29   CBC Recent Labs  Lab 01/20/19 1436 01/22/19 1042 01/22/19 1056  WBC 10.4 7.7  --   NEUTROABS 8,299* 5.9  --   HGB 16.1* 14.9 15.0  HCT 47.2* 43.4 44.0  MCV 92.5 94.6  --   PLT 151 116*  --    Basic Metabolic Panel Recent Labs  Lab 01/20/19 1436 01/22/19 1042 01/22/19 1056  NA 133* 131* 133*  K 4.5 4.6 4.6  CL 96* 97*  --   CO2 24 23  --   GLUCOSE 269* 138*  --   BUN 19 20  --   CREATININE 1.05* 1.13*  --   CALCIUM 11.1* 10.0  --    Iron/TIBC/Ferritin/ %Sat    Component Value Date/Time   IRON 29 01/15/2016 1422   TIBC 427 01/15/2016 1422   FERRITIN 13 01/15/2016 1422   IRONPCTSAT 7 (L) 01/15/2016 1422    Vitals:   01/22/19 1018 01/22/19 1030 01/22/19 1045 01/22/19 1215  BP: (!) 123/51 (!) 101/46  (!) 125/54  Pulse: 89 83    Resp: _0 Temp: 97.8 F (36.6 C)  97.9 F (36.6 C)   TempSrc: Oral  Rectal   SpO2: 99% 98%      Exam Gen alert mod obese, no distress, calm and pleasant No rash, cyanosis or gangrene Sclera anicteric, throat clear  No jvd or bruits Chest clear bilat to bases RRR no MRG Abd soft ntnd no mass or ascites +bs, ^'d BS GU normal male MS no joint effusions or deformity Ext no LE edema, no wounds or ulcers Neuro is alert, Ox 3 , nf     Home meds:  - asa 81/ lipitor 20 / coreg 25 bid/ hydralazine 50 tid/ aldactone 25 qd  - terazosin 5 hs  - sitagliptin 100 qd/ metformin-xr 2 gm qam  - prn's/ vitamins/ supplements    B/l creat 1.5- 1.7 in mid 2020   Na 136  K 4.3 OC2 23 BUN 29  Creat 2.11  eGFR 32     WBC 8k  Hb 12  plt 431  UA 100 prot, 0-5 rbc/ wbc, clear    CT abd/ pelv w/ contrast > IMPRESSION: 1. Mild small bowel dilatation and wall thickening, with transition point at site of surgical staples in anterior right  abdomen. This is suspicious for enteritis and partial small bowel obstruction. 2. Sigmoid diverticulosis, without radiographic evidence of diverticulitis.     HR 68  RR 18- 20  Temp 98.7  BP 166/73     Assessment/ Plan: 1. Nausea/ vomiting /diarrhea/ abd pain - enteritis vs partial SBO.  Gen surg called and recommended NG , but pt refused NG.  Admit, IVF"s, npo for now except for BP meds. Pain meds and nausea meds prn.  2. HTN - take BP pills w/ sips if tolerated, if not can give schedule IV metop or some other IV medication.  3. AoCKD3 - due to dehydration most likely, got NS bolus and getting IVF's now. Recheck in am. Got IV contrast for abd CT as well.  4. DM2 - using q4h SSI while here 5. HL - resume meds when eating      Kelly Splinter, MD   [facility] 01/22/2019, 1:10 PM

## 2019-06-24 ENCOUNTER — Inpatient Hospital Stay (HOSPITAL_COMMUNITY): Payer: No Typology Code available for payment source

## 2019-06-24 ENCOUNTER — Other Ambulatory Visit: Payer: Self-pay

## 2019-06-24 ENCOUNTER — Telehealth: Payer: Self-pay | Admitting: Family Medicine

## 2019-06-24 LAB — BASIC METABOLIC PANEL
Anion gap: 12 (ref 5–15)
BUN: 28 mg/dL — ABNORMAL HIGH (ref 8–23)
CO2: 21 mmol/L — ABNORMAL LOW (ref 22–32)
Calcium: 8.7 mg/dL — ABNORMAL LOW (ref 8.9–10.3)
Chloride: 105 mmol/L (ref 98–111)
Creatinine, Ser: 1.88 mg/dL — ABNORMAL HIGH (ref 0.61–1.24)
GFR calc Af Amer: 43 mL/min — ABNORMAL LOW (ref >60)
GFR calc non Af Amer: 37 mL/min — ABNORMAL LOW (ref >60)
Glucose, Bld: 93 mg/dL (ref 70–99)
Potassium: 3.9 mmol/L (ref 3.5–5.1)
Sodium: 138 mmol/L (ref 135–145)

## 2019-06-24 LAB — CBC
HCT: 35.3 % — ABNORMAL LOW (ref 39.0–52.0)
Hemoglobin: 11.4 g/dL — ABNORMAL LOW (ref 13.0–17.0)
MCH: 29.9 pg (ref 26.0–34.0)
MCHC: 32.3 g/dL (ref 30.0–36.0)
MCV: 92.7 fL (ref 80.0–100.0)
Platelets: 417 10*3/uL — ABNORMAL HIGH (ref 150–400)
RBC: 3.81 MIL/uL — ABNORMAL LOW (ref 4.22–5.81)
RDW: 13.2 % (ref 11.5–15.5)
WBC: 9.3 10*3/uL (ref 4.0–10.5)
nRBC: 0 % (ref 0.0–0.2)

## 2019-06-24 LAB — GLUCOSE, CAPILLARY
Glucose-Capillary: 91 mg/dL (ref 70–99)
Glucose-Capillary: 83 mg/dL (ref 70–99)
Glucose-Capillary: 68 mg/dL — ABNORMAL LOW (ref 70–99)
Glucose-Capillary: 118 mg/dL — ABNORMAL HIGH (ref 70–99)
Glucose-Capillary: 101 mg/dL — ABNORMAL HIGH (ref 70–99)

## 2019-06-24 LAB — CBG MONITORING, ED
Glucose-Capillary: 83 mg/dL (ref 70–99)
Glucose-Capillary: 98 mg/dL (ref 70–99)

## 2019-06-24 LAB — HEMOGLOBIN A1C
Hgb A1c MFr Bld: 7.6 % — ABNORMAL HIGH (ref 4.8–5.6)
Mean Plasma Glucose: 171.42 mg/dL

## 2019-06-24 MED ORDER — DIATRIZOATE MEGLUMINE & SODIUM 66-10 % PO SOLN
ORAL | Status: AC
  Filled 2019-06-24: qty 90

## 2019-06-24 MED ORDER — SODIUM CHLORIDE 0.9 % IV SOLN: INTRAVENOUS | Status: DC

## 2019-06-24 MED ORDER — DIATRIZOATE MEGLUMINE & SODIUM 66-10 % PO SOLN
90.0000 mL | Freq: Once | ORAL | Status: AC
  Administered 2019-06-24: 90 mL via ORAL
  Filled 2019-06-24: qty 90

## 2019-06-24 MED ORDER — ACETAMINOPHEN 325 MG PO TABS
650.0000 mg | ORAL_TABLET | Freq: Four times a day (QID) | ORAL | Status: DC | PRN
  Administered 2019-06-24: 650 mg via ORAL
  Filled 2019-06-24: qty 2

## 2019-06-24 MED ORDER — DEXTROSE 50 % IV SOLN
INTRAVENOUS | Status: AC
  Administered 2019-06-24: 25 mL
  Filled 2019-06-24: qty 50

## 2019-06-24 MED ORDER — DIATRIZOATE MEGLUMINE & SODIUM 66-10 % PO SOLN
90.0000 mL | Freq: Once | ORAL | Status: DC
  Filled 2019-06-24: qty 90

## 2019-06-24 NOTE — Progress Notes (Signed)
PROGRESS NOTE    Jim Sullivan  PZW:[id number] DOB: 1955-02-24 DOA: 06/23/2019 PCP: Claretta Fraise, MD   Brief Narrative:  Per HPI:The patient is a 64 y.o. year-old w/ hx of HTN, HL, DM2 on oral agents and h/o Meckel's diverticulum surgery presenting w/ nausea/ vomiting / diarrhea for the last 5 days. Having abd pains off and on as well.  Hasn't vomited the last 2 days and last diarrhea yesterday, but not eating now and says if he was trying to eat he would still be having diarrhea. W/u in ED showed no fevers, normal vital signs and labs show normal electrolytes, ^creat 2.1 (baseline 1.7) , normal LFT"S / albumin, WBC normal 8K and Hb 12 and uA negative.  CT abd w/ contrast showing SB dilatation and wall thickening w/ possible transition point, suspecting enteritis vs partial SBO.  Gen surg Dr Constance Haw was called , they recommended NG tube but pt refused. Asked to see for admission.   Pt states he got sick after a meal and had fevers and chills initially 5 days ago.  Since then abdomen has been "growling" and can't keep anything down , also having watery diarrhea after attempts to eat.  H/o Meckel's diverticulum surgery in the past. Also had hernia repair as a child and again as an adult.  Taking medication for HTN and HL and po meds for DM2.   -Patient admitted for partial small bowel obstruction with small bowel follow-through ordered and pending per general surgery.  Currently n.p.o. except sips with medications.  He is also noted to have AKI on CKD stage III with improvement in creatinine levels noted today.   Assessment & Plan:   Principal Problem:   Nausea & vomiting Active Problems:   Essential hypertension   Type 2 diabetes mellitus without complication, without long-term current use of insulin (HCC)   Abdominal pain   Partial small bowel obstruction (HCC)   Partial small bowel obstruction -Appreciate general surgery recommendations with SBFT -Advance diet as tolerated per general  surgery -Continue IV fluid with normal saline for now  AKI on CKD stage III-improving -Likely secondary to dehydration as well as recent contrast used -Continue IV fluid for now -Follow a.m. labs  Hypertension-stable -Continue carvedilol and hydralazine as tolerated  Type 2 diabetes-stable -Continue SSI every 4 hours  Dyslipidemia -Continue home medications once eating  DVT prophylaxis: Lovenox Code Status: Full code Family Communication: None at bedside Disposition Plan: Continue ongoing treatment for partial SBO with IV fluid.  Appreciate general surgery recommendations.  Status is: Inpatient  Remains inpatient appropriate because:IV treatments appropriate due to intensity of illness or inability to take PO   Dispo: The patient is from: Home              Anticipated d/c is to: Home              Anticipated d/c date is: 1 day              Patient currently is not medically stable to d/c.  Consultants:   General surgery  Procedures:   See below  Antimicrobials:   None   Subjective: Patient seen and evaluated today with no complaints of nausea or vomiting noted currently.  He denies any abdominal pain.  Objective: Vitals:   06/23/19 2300 06/24/19 0551 06/24/19 0820 06/24/19 1233  BP: 133/64 (!) 135/49 129/66 (!) 148/64  Pulse:  99 74 72  Resp:  17 16 16   Temp:   98.5 F (36.9 C)  98.6 F (37 C)  TempSrc:   Oral   SpO2:  99% 98% 99%  Weight:      Height:       No intake or output data in the 24 hours ending 06/24/19 1540 Filed Weights   06/23/19 1209  Weight: 99.8 kg    Examination:  General exam: Appears calm and comfortable  Respiratory system: Clear to auscultation. Respiratory effort normal. Cardiovascular system: S1 & S2 heard, RRR. No JVD, murmurs, rubs, gallops or clicks. No pedal edema. Gastrointestinal system: Abdomen is nondistended, soft and nontender. No organomegaly or masses felt. Normal bowel sounds heard. Central nervous system:  Alert and oriented. No focal neurological deficits. Extremities: Symmetric 5 x 5 power. Skin: No rashes, lesions or ulcers Psychiatry: Judgement and insight appear normal. Mood & affect appropriate.     Data Reviewed: I have personally reviewed following labs and imaging studies  CBC: Recent Labs  Lab 06/23/19 1229 06/24/19 0352  WBC 8.8 9.3  HGB 12.1* 11.4*  HCT 37.1* 35.3*  MCV 91.8 92.7  PLT 431* 852*   Basic Metabolic Panel: Recent Labs  Lab 06/23/19 1229 06/24/19 0352  NA 136 138  K 4.3 3.9  CL 104 105  CO2 23 21*  GLUCOSE 142* 93  BUN 29* 28*  CREATININE 2.11* 1.88*  CALCIUM 8.9 8.7*   GFR: Estimated Creatinine Clearance: 46.8 mL/min (A) (by C-G formula based on SCr of 1.88 mg/dL (H)). Liver Function Tests: Recent Labs  Lab 06/23/19 1229  AST 21  ALT 21  ALKPHOS 81  BILITOT 0.5  PROT 7.6  ALBUMIN 3.8   Recent Labs  Lab 06/23/19 1229  LIPASE 25   No results for input(s): AMMONIA in the last 168 hours. Coagulation Profile: No results for input(s): INR, PROTIME in the last 168 hours. Cardiac Enzymes: No results for input(s): CKTOTAL, CKMB, CKMBINDEX, TROPONINI in the last 168 hours. BNP (last 3 results) No results for input(s): PROBNP in the last 8760 hours. HbA1C: Recent Labs    06/23/19 1229  HGBA1C 7.6*   CBG: Recent Labs  Lab 06/23/19 2147 06/24/19 0134 06/24/19 0443 06/24/19 0843 06/24/19 1148  GLUCAP 102* 83 98 91 83   Lipid Profile: No results for input(s): CHOL, HDL, LDLCALC, TRIG, CHOLHDL, LDLDIRECT in the last 72 hours. Thyroid Function Tests: No results for input(s): TSH, T4TOTAL, FREET4, T3FREE, THYROIDAB in the last 72 hours. Anemia Panel: No results for input(s): VITAMINB12, FOLATE, FERRITIN, TIBC, IRON, RETICCTPCT in the last 72 hours. Sepsis Labs: No results for input(s): PROCALCITON, LATICACIDVEN in the last 168 hours.  Recent Results (from the past 240 hour(s))  SARS Coronavirus 2 by RT PCR (hospital order,  performed in [facility] hospital lab) Nasopharyngeal Nasopharyngeal Swab     Status: None   Collection Time: 06/23/19  6:05 PM   Specimen: Nasopharyngeal Swab  Result Value Ref Range Status   SARS Coronavirus 2 NEGATIVE NEGATIVE Final    Comment: (NOTE) SARS-CoV-2 target nucleic acids are NOT DETECTED. The SARS-CoV-2 RNA is generally detectable in upper and lower respiratory specimens during the acute phase of infection. The lowest concentration of SARS-CoV-2 viral copies this assay can detect is 250 copies / mL. A negative result does not preclude SARS-CoV-2 infection and should not be used as the sole basis for treatment or other patient management decisions.  A negative result may occur with improper specimen collection / handling, submission of specimen other than nasopharyngeal swab, presence of viral mutation(s) within the areas targeted by this  assay, and inadequate number of viral copies (<250 copies / mL). A negative result must be combined with clinical observations, patient history, and epidemiological information. Fact Sheet for Patients:   StrictlyIdeas.no Fact Sheet for Healthcare Providers: BankingDealers.co.za This test is not yet approved or cleared  by the [place] FDA and has been authorized for detection and/or diagnosis of SARS-CoV-2 by FDA under an Emergency Use Authorization (EUA).  This EUA will remain in effect (meaning this test can be used) for the duration of the COVID-19 declaration under Section 564(b)(1) of the Act, 21 U.S.C. section 360bbb-3(b)(1), unless the authorization is terminated or revoked sooner. Performed at [facility], [address], [place], [place] [address]          Radiology Studies: CT ABDOMEN PELVIS W CONTRAST  Result Date: 06/23/2019 CLINICAL DATA:  Abdominal pain, vomiting, and diarrhea for 5 days. EXAM: CT ABDOMEN AND PELVIS WITH CONTRAST TECHNIQUE: Multidetector CT imaging  of the abdomen and pelvis was performed using the standard protocol following bolus administration of intravenous contrast. CONTRAST:  12mL OMNIPAQUE IOHEXOL 300 MG/ML  SOLN COMPARISON:  None. FINDINGS: Lower Chest: No acute findings. Hepatobiliary: No hepatic masses identified. Gallbladder is unremarkable. No evidence of biliary ductal dilatation. Pancreas:  No mass or inflammatory changes. Spleen: Within normal limits in size and appearance. Adrenals/Urinary Tract: No masses identified. No evidence of ureteral calculi or hydronephrosis. Stomach/Bowel: Mild wall thickening and dilatation is seen involving multiple small bowel loops in the mid abdomen and upper pelvis. There is sparing of the distal ileum. A transition point is seen the anterior right abdomen at the site of small bowel surgical staples, suggesting a partial small bowel obstruction. Diverticulosis is seen mainly involving the sigmoid colon, however there is no evidence of diverticulitis. Vascular/Lymphatic: No pathologically enlarged lymph nodes. No abdominal aortic aneurysm. Aortic atherosclerosis noted. Reproductive:  No mass or other significant abnormality. Other:  None. Musculoskeletal:  No suspicious bone lesions identified. IMPRESSION: 1. Mild small bowel dilatation and wall thickening, with transition point at site of surgical staples in anterior right abdomen. This is suspicious for enteritis and partial small bowel obstruction. 2. Sigmoid diverticulosis, without radiographic evidence of diverticulitis. Aortic Atherosclerosis (ICD10-I70.0). Electronically Signed   By: Marlaine Hind M.D.   On: 06/23/2019 16:25        Scheduled Meds: . carvedilol  25 mg Oral BID WC  . diatrizoate meglumine-sodium  90 mL Oral Once  . diatrizoate meglumine-sodium      . enoxaparin (LOVENOX) injection  40 mg Subcutaneous Q24H  . hydrALAZINE  50 mg Oral TID  . insulin aspart  0-9 Units Subcutaneous Q4H   Continuous Infusions: . sodium chloride 75  mL/hr at 06/24/19 1207     LOS: 1 day    Time spent: 35 minutes    Jim Sullivan Darleen Crocker, DO [facility]  If 7PM-7AM, please contact night-coverage [email or web address] 06/24/2019, 3:40 PM

## 2019-06-24 NOTE — Progress Notes (Signed)
[facility] Progress Note     Subjective: Patient angry and upset because he has had to wait on diet. Xray with contrast in the rectum. Had 3 Bms. I was unaware.  Apologized to patient for delay due to caring for more critical patients.   Objective: Vital signs in last 24 hours: Temp:  [98.5 F (36.9 C)-99.1 F (37.3 C)] 99.1 F (37.3 C) (06/08 1630) Pulse Rate:  [72-99] 72 (06/08 1630) Resp:  [16-17] 16 (06/08 1630) BP: (129-148)/(49-66) 139/54 (06/08 1630) SpO2:  [98 %-99 %] 98 % (06/08 1630) Last BM Date: 06/24/19  Intake/Output from previous day: No intake/output data recorded. Intake/Output this shift: No intake/output data recorded.  General appearance: alert and cooperative Resp: normal work of breathing GI: reports no pain  Lab Results:  Recent Labs    06/23/19 1229 06/24/19 0352  WBC 8.8 9.3  HGB 12.1* 11.4*  HCT 37.1* 35.3*  PLT 431* 417*   BMET Recent Labs    06/23/19 1229 06/24/19 0352  NA 136 138  K 4.3 3.9  CL 104 105  CO2 23 21*  GLUCOSE 142* 93  BUN 29* 28*  CREATININE 2.11* 1.88*  CALCIUM 8.9 8.7*   Xray after gastrografin with contrast in the rectum  Studies/Results: CT ABDOMEN PELVIS W CONTRAST  Result Date: 06/23/2019 CLINICAL DATA:  Abdominal pain, vomiting, and diarrhea for 5 days. EXAM: CT ABDOMEN AND PELVIS WITH CONTRAST TECHNIQUE: Multidetector CT imaging of the abdomen and pelvis was performed using the standard protocol following bolus administration of intravenous contrast. CONTRAST:  41mL OMNIPAQUE IOHEXOL 300 MG/ML  SOLN COMPARISON:  None. FINDINGS: Lower Chest: No acute findings. Hepatobiliary: No hepatic masses identified. Gallbladder is unremarkable. No evidence of biliary ductal dilatation. Pancreas:  No mass or inflammatory changes. Spleen: Within normal limits in size and appearance. Adrenals/Urinary Tract: No masses identified. No evidence of ureteral calculi or hydronephrosis. Stomach/Bowel: Mild wall  thickening and dilatation is seen involving multiple small bowel loops in the mid abdomen and upper pelvis. There is sparing of the distal ileum. A transition point is seen the anterior right abdomen at the site of small bowel surgical staples, suggesting a partial small bowel obstruction. Diverticulosis is seen mainly involving the sigmoid colon, however there is no evidence of diverticulitis. Vascular/Lymphatic: No pathologically enlarged lymph nodes. No abdominal aortic aneurysm. Aortic atherosclerosis noted. Reproductive:  No mass or other significant abnormality. Other:  None. Musculoskeletal:  No suspicious bone lesions identified. IMPRESSION: 1. Mild small bowel dilatation and wall thickening, with transition point at site of surgical staples in anterior right abdomen. This is suspicious for enteritis and partial small bowel obstruction. 2. Sigmoid diverticulosis, without radiographic evidence of diverticulitis. Aortic Atherosclerosis (ICD10-I70.0). Electronically Signed   By: Marlaine Hind M.D.   On: 06/23/2019 16:25   DG Abd Portable 1V-Small Bowel Obstruction Protocol-initial, 8 hr delay  Result Date: 06/24/2019 CLINICAL DATA:  Right lower abdominal pain. EXAM: PORTABLE ABDOMEN - 1 VIEW COMPARISON:  None. FINDINGS: The bowel gas pattern is normal. Multiple areas of retained radiopaque contrast are seen scattered throughout the large bowel. No radio-opaque calculi or other significant radiographic abnormality are seen. IMPRESSION: Normal bowel gas pattern. Electronically Signed   By: Virgina Norfolk M.D.   On: 06/24/2019 19:29    Assessment/Plan: Mr. Monger is a 64 yo with a SBO that is resolving. Clear liquids, adv to soft if tolerates, RN at night can adv Should be able to be discharged tomorrow AM if tolerates soft diet  LOS: 1 day    Virl Cagey 06/24/2019

## 2019-06-24 NOTE — Progress Notes (Signed)
Has had no vomiting today and 3 bms with the last one having some solid pieces.  Blood glucose 68.  Received 25 mls D50

## 2019-06-24 NOTE — Telephone Encounter (Signed)
Let him know I appreciate the call. I will follow his progress in the chart as he recovers from the small bowel obstruction. Thanks, WS

## 2019-06-25 LAB — BASIC METABOLIC PANEL
Anion gap: 9 (ref 5–15)
BUN: 20 mg/dL (ref 8–23)
CO2: 22 mmol/L (ref 22–32)
Calcium: 8.6 mg/dL — ABNORMAL LOW (ref 8.9–10.3)
Chloride: 106 mmol/L (ref 98–111)
Creatinine, Ser: 1.67 mg/dL — ABNORMAL HIGH (ref 0.61–1.24)
GFR calc Af Amer: 50 mL/min — ABNORMAL LOW (ref >60)
GFR calc non Af Amer: 43 mL/min — ABNORMAL LOW (ref >60)
Glucose, Bld: 107 mg/dL — ABNORMAL HIGH (ref 70–99)
Potassium: 4 mmol/L (ref 3.5–5.1)
Sodium: 137 mmol/L (ref 135–145)

## 2019-06-25 LAB — MAGNESIUM: Magnesium: 1.9 mg/dL (ref 1.7–2.4)

## 2019-06-25 LAB — GLUCOSE, CAPILLARY: Glucose-Capillary: 102 mg/dL — ABNORMAL HIGH (ref 70–99)

## 2019-06-25 NOTE — Progress Notes (Signed)
Pt stated the IV in his hand was itching and irritating him and he wanted it removed. Advised pt I will have to insert at another location, pt agreed. Pt advised he did not want to be bothered for at least 6 hours as he has had no rest. Pt notified Tech Pam of CBG q4 refusal and for morning vital sign time. The patient stated he has been tolerating the clear liquid diet well. No reports of nausea, diarrhea, vomiting, or abdominal pain.

## 2019-06-25 NOTE — Progress Notes (Signed)
No nausea or vomiting.  No IV.  Discharge instructions reviewed.  To walk to car when wife returns.

## 2019-06-25 NOTE — Progress Notes (Signed)
[facility] Progress Note     Subjective: Tolerated clears. Wants to eat more but did not like the eggs, muffin sent up for soft breakfast today. Also got bacon?   His wife is bringing him yogurt. Having multiple Bms.   Objective: Vital signs in last 24 hours: Temp:  [98 F (36.7 C)-99.1 F (37.3 C)] 98 F (36.7 C) (06/09 0604) Pulse Rate:  [63-81] 63 (06/09 1034) Resp:  [16-20] 20 (06/09 0604) BP: (139-175)/(54-75) 175/75 (06/09 1034) SpO2:  [97 %-100 %] 97 % (06/09 0604) Last BM Date: 06/24/19  Intake/Output from previous day: 06/08 0701 - 06/09 0700 In: 1156.8 [P.O.:400; I.V.:756.8] Out: -  Intake/Output this shift: Total I/O In: 240 [P.O.:240] Out: -   General appearance: alert, cooperative and no distress Resp: normal work of breathing GI: abdomen soft, nontender  Lab Results:  Recent Labs    06/23/19 1229 06/24/19 0352  WBC 8.8 9.3  HGB 12.1* 11.4*  HCT 37.1* 35.3*  PLT 431* 417*   BMET Recent Labs    06/24/19 0352 06/25/19 0549  NA 138 137  K 3.9 4.0  CL 105 106  CO2 21* 22  GLUCOSE 93 107*  BUN 28* 20  CREATININE 1.88* 1.67*  CALCIUM 8.7* 8.6*   PT/INR No results for input(s): LABPROT, INR in the last 72 hours.  Studies/Results: CT ABDOMEN PELVIS W CONTRAST  Result Date: 06/23/2019 CLINICAL DATA:  Abdominal pain, vomiting, and diarrhea for 5 days. EXAM: CT ABDOMEN AND PELVIS WITH CONTRAST TECHNIQUE: Multidetector CT imaging of the abdomen and pelvis was performed using the standard protocol following bolus administration of intravenous contrast. CONTRAST:  69mL OMNIPAQUE IOHEXOL 300 MG/ML  SOLN COMPARISON:  None. FINDINGS: Lower Chest: No acute findings. Hepatobiliary: No hepatic masses identified. Gallbladder is unremarkable. No evidence of biliary ductal dilatation. Pancreas:  No mass or inflammatory changes. Spleen: Within normal limits in size and appearance. Adrenals/Urinary Tract: No masses identified. No evidence of  ureteral calculi or hydronephrosis. Stomach/Bowel: Mild wall thickening and dilatation is seen involving multiple small bowel loops in the mid abdomen and upper pelvis. There is sparing of the distal ileum. A transition point is seen the anterior right abdomen at the site of small bowel surgical staples, suggesting a partial small bowel obstruction. Diverticulosis is seen mainly involving the sigmoid colon, however there is no evidence of diverticulitis. Vascular/Lymphatic: No pathologically enlarged lymph nodes. No abdominal aortic aneurysm. Aortic atherosclerosis noted. Reproductive:  No mass or other significant abnormality. Other:  None. Musculoskeletal:  No suspicious bone lesions identified. IMPRESSION: 1. Mild small bowel dilatation and wall thickening, with transition point at site of surgical staples in anterior right abdomen. This is suspicious for enteritis and partial small bowel obstruction. 2. Sigmoid diverticulosis, without radiographic evidence of diverticulitis. Aortic Atherosclerosis (ICD10-I70.0). Electronically Signed   By: Marlaine Hind M.D.   On: 06/23/2019 16:25   DG Abd Portable 1V-Small Bowel Obstruction Protocol-initial, 8 hr delay  Result Date: 06/24/2019 CLINICAL DATA:  Right lower abdominal pain. EXAM: PORTABLE ABDOMEN - 1 VIEW COMPARISON:  None. FINDINGS: The bowel gas pattern is normal. Multiple areas of retained radiopaque contrast are seen scattered throughout the large bowel. No radio-opaque calculi or other significant radiographic abnormality are seen. IMPRESSION: Normal bowel gas pattern. Electronically Signed   By: Virgina Norfolk M.D.   On: 06/24/2019 19:29    Anti-infectives: Anti-infectives (From admission, onward)   None      Assessment/Plan: Mr. Gomes is a 64 yo with resolving SBO. Doing  well and having BMs. Eating more than clears this AM.  -Dc home once tolerates the yogurt, he can adv his diet slowly at home -Patient aware that SBO could return and it  is unpredictable -Patient following up with GI for colonoscopy as he is due and has diverticulosis on CT -PRN Follow up with surgery  Discussed with Dr. Dyann Kief.    LOS: 2 days    Virl Cagey 06/25/2019

## 2019-06-25 NOTE — Discharge Summary (Signed)
Physician Discharge Summary  Jim Sullivan NTZ:[id number] DOB: [birth year] DOA: 06/23/2019  PCP: Claretta Fraise, MD  Admit date: 06/23/2019 Discharge date: 06/25/2019  Time spent: 35 minutes.  Recommendations for Outpatient Follow-up:  1. Continue close monitoring of patient's blood pressure and CBGs with further adjustment to hypoglycemic agents and antihypertensive regimen as needed. 2. Repeat metabolic panel to evaluate lites renal function.   Discharge Diagnoses:  Principal Problem:   Nausea & vomiting Active Problems:   Essential hypertension   Type 2 diabetes mellitus without complication, without long-term current use of insulin (HCC)   Abdominal pain   Partial small bowel obstruction ([place])   Discharge Condition: Stable and improved.  Discharged home with instruction to follow-up with PCP and gastroenterology as an outpatient.  CODE STATUS: Full code.  Diet recommendation: Heart healthy/modified carbohydrate diet with low residue.  Filed Weights   06/23/19 1209  Weight: 99.8 kg    History of present illness:  As per H&P written by Dr. Jonnie Finner on 06/23/2019 64 y.o. year-old w/ hx of HTN, HL, DM2 on oral agents and h/o Meckel's diverticulum surgery presenting w/ nausea/ vomiting / diarrhea for the last 5 days. Having abd pains off and on as well.  Hasn't vomited the last 2 days and last diarrhea yesterday, but not eating now and says if he was trying to eat he would still be having diarrhea. W/u in ED showed no fevers, normal vital signs and labs show normal electrolytes, ^creat 2.1 (baseline 1.7) , normal LFT"S / albumin, WBC normal 8K and Hb 12 and uA negative.  CT abd w/ contrast showing SB dilatation and wall thickening w/ possible transition point, suspecting enteritis vs partial SBO.  Gen surg Dr Constance Haw was called , they recommended NG tube but pt refused. Asked to see for admission.   Pt states he got sick after a meal and had fevers and chills initially 5 days ago.  Since  then abdomen has been "growling" and can't keep anything down , also having watery diarrhea after attempts to eat.  H/o Meckel's diverticulum surgery in the past. Also had hernia repair as a child and again as an adult.  Taking medication for HTN and HL and po meds for DM2.   Hospital Course:  1-partial SBO -in the setting of mild adhesions from previous Meckel's diverticulum resection. -No fever, normal WBCs and hemodynamically stable. -After bowel rest, IV fluids and as needed/conservative management symptoms completely resolved. -Patient able to tolerate soft diet without any abdominal pain, nausea or vomiting at discharge. -Advised to maintain adequate hydration and to use as needed stool softeners -Outpatient follow-up with gastroenterology for colonoscopy evaluation discussed. -Heart healthy/modified carbohydrate diet with low residue recommended.  2-acute kidney injury on chronic kidney disease stage IIIa -In the setting of dehydration/prerenal azotemia. -Improved and back to baseline at discharge -Advised to maintain adequate hydration -Repeat basic metabolic panel follow-up visit.  3-essential hypertension -Stable and well-controlled -Advised to follow heart healthy diet -Continue home antihypertensive regimen.  4-type 2 diabetes with nephropathy -Resume home oral hypoglycemic agents -Patient advised to maintain adequate hydration.  5-dyslipidemia -Continue statin  6-history of BPH -No complaints of urinary retention symptoms -Resume the use of Hytrin  Procedures: See below for x-ray reports  Consultations:  General surgery  Discharge Exam: Vitals:   06/25/19 0604 06/25/19 1034  BP: (!) 140/59 (!) 175/75  Pulse: 65 63  Resp: 20   Temp: 98 F (36.7 C)   SpO2: 97%     General: Afebrile,  no chest pain, no nausea, no vomiting.  Tolerating diet.  Reports having couple bowel movements overnight and feeling ready to go home.  No abdominal pain. Cardiovascular:  No JVD appreciated on exam. Respiratory: Speaking in full sentences, reports no shortness of breath.  Good oxygen saturation on room air.  Normal respiratory effort. Abdomen: Reports no abdominal pain.  Positive bowel sounds and positive bowel movements overnight.  Discharge Instructions   Discharge Instructions    Diet - low sodium heart healthy   Complete by: As directed    Discharge instructions   Complete by: As directed    Keep yourself well-hydrated (half of your weight in ounces of water on daily basis). Use as needed over-the-counter MiraLAX as stool softener. Increase fiber in your diet. Follow heart healthy and modified carbohydrates diet. Arrange follow-up with PCP in 10 days. Patient follow-up with gastroenterology service as discussed for colonoscopy surveillance.     Allergies as of 06/25/2019      Reactions   Morphine And Related Itching   Skin crawling      Medication List    TAKE these medications   aspirin EC 81 MG tablet Take 81 mg by mouth every evening.   atorvastatin 20 MG tablet Commonly known as: LIPITOR Take 1 tablet (20 mg total) by mouth daily.   carvedilol 25 MG tablet Commonly known as: COREG Take 1 tablet (25 mg total) by mouth 2 (two) times daily with a meal.   hydrALAZINE 50 MG tablet Commonly known as: APRESOLINE Take 1 tablet (50 mg total) by mouth 3 (three) times daily.   metFORMIN 500 MG 24 hr tablet Commonly known as: GLUCOPHAGE-XR Take 2 tablets (1,000 mg total) by mouth daily with breakfast.   sitaGLIPtin 100 MG tablet Commonly known as: Januvia Take 1 tablet (100 mg total) by mouth daily. What changed: when to take this   spironolactone 25 MG tablet Commonly known as: ALDACTONE Take 1 tablet (25 mg total) by mouth daily.   terazosin 5 MG capsule Commonly known as: HYTRIN Take 1 capsule (5 mg total) by mouth at bedtime.   Vitamin D (Ergocalciferol) 1.25 MG (50000 UNIT) Caps capsule Commonly known as: DRISDOL Take 1  capsule (50,000 Units total) by mouth every 7 (seven) days.      Allergies  Allergen Reactions   Morphine And Related Itching    Skin crawling   Follow-up Information    Stacks, Cletus Gash, MD. Schedule an appointment as soon as possible for a visit in 10 day(s).   Specialty: Family Medicine Contact information: [place]  [phone]           The results of significant diagnostics from this hospitalization (including imaging, microbiology, ancillary and laboratory) are listed below for reference.    Significant Diagnostic Studies: CT ABDOMEN PELVIS W CONTRAST  Result Date: 06/23/2019 CLINICAL DATA:  Abdominal pain, vomiting, and diarrhea for 5 days. EXAM: CT ABDOMEN AND PELVIS WITH CONTRAST TECHNIQUE: Multidetector CT imaging of the abdomen and pelvis was performed using the standard protocol following bolus administration of intravenous contrast. CONTRAST:  30mL OMNIPAQUE IOHEXOL 300 MG/ML  SOLN COMPARISON:  None. FINDINGS: Lower Chest: No acute findings. Hepatobiliary: No hepatic masses identified. Gallbladder is unremarkable. No evidence of biliary ductal dilatation. Pancreas:  No mass or inflammatory changes. Spleen: Within normal limits in size and appearance. Adrenals/Urinary Tract: No masses identified. No evidence of ureteral calculi or hydronephrosis. Stomach/Bowel: Mild wall thickening and dilatation is seen involving multiple small bowel  loops in the mid abdomen and upper pelvis. There is sparing of the distal ileum. A transition point is seen the anterior right abdomen at the site of small bowel surgical staples, suggesting a partial small bowel obstruction. Diverticulosis is seen mainly involving the sigmoid colon, however there is no evidence of diverticulitis. Vascular/Lymphatic: No pathologically enlarged lymph nodes. No abdominal aortic aneurysm. Aortic atherosclerosis noted. Reproductive:  No mass or other significant abnormality. Other:  None.  Musculoskeletal:  No suspicious bone lesions identified. IMPRESSION: 1. Mild small bowel dilatation and wall thickening, with transition point at site of surgical staples in anterior right abdomen. This is suspicious for enteritis and partial small bowel obstruction. 2. Sigmoid diverticulosis, without radiographic evidence of diverticulitis. Aortic Atherosclerosis (ICD10-I70.0). Electronically Signed   By: Marlaine Hind M.D.   On: 06/23/2019 16:25   DG Abd Portable 1V-Small Bowel Obstruction Protocol-initial, 8 hr delay  Result Date: 06/24/2019 CLINICAL DATA:  Right lower abdominal pain. EXAM: PORTABLE ABDOMEN - 1 VIEW COMPARISON:  None. FINDINGS: The bowel gas pattern is normal. Multiple areas of retained radiopaque contrast are seen scattered throughout the large bowel. No radio-opaque calculi or other significant radiographic abnormality are seen. IMPRESSION: Normal bowel gas pattern. Electronically Signed   By: Virgina Norfolk M.D.   On: 06/24/2019 19:29    Microbiology: Recent Results (from the past 240 hour(s))  SARS Coronavirus 2 by RT PCR (hospital order, performed in [facility] hospital lab) Nasopharyngeal Nasopharyngeal Swab     Status: None   Collection Time: 06/23/19  6:05 PM   Specimen: Nasopharyngeal Swab  Result Value Ref Range Status   SARS Coronavirus 2 NEGATIVE NEGATIVE Final    Comment: (NOTE) SARS-CoV-2 target nucleic acids are NOT DETECTED. The SARS-CoV-2 RNA is generally detectable in upper and lower respiratory specimens during the acute phase of infection. The lowest concentration of SARS-CoV-2 viral copies this assay can detect is 250 copies / mL. A negative result does not preclude SARS-CoV-2 infection and should not be used as the sole basis for treatment or other patient management decisions.  A negative result may occur with improper specimen collection / handling, submission of specimen other than nasopharyngeal swab, presence of viral mutation(s) within  the areas targeted by this assay, and inadequate number of viral copies (<250 copies / mL). A negative result must be combined with clinical observations, patient history, and epidemiological information. Fact Sheet for Patients:   StrictlyIdeas.no Fact Sheet for Healthcare Providers: BankingDealers.co.za This test is not yet approved or cleared  by the [place] FDA and has been authorized for detection and/or diagnosis of SARS-CoV-2 by FDA under an Emergency Use Authorization (EUA).  This EUA will remain in effect (meaning this test can be used) for the duration of the COVID-19 declaration under Section 564(b)(1) of the Act, 21 U.S.C. section 360bbb-3(b)(1), unless the authorization is terminated or revoked sooner. Performed at [facility], [address], [place], [place] [address]      Labs: Basic Metabolic Panel: Recent Labs  Lab 06/23/19 1229 06/24/19 0352 06/25/19 0549  NA 136 138 137  K 4.3 3.9 4.0  CL 104 105 106  CO2 23 21* 22  GLUCOSE 142* 93 107*  BUN 29* 28* 20  CREATININE 2.11* 1.88* 1.67*  CALCIUM 8.9 8.7* 8.6*  MG  --   --  1.9   Liver Function Tests: Recent Labs  Lab 06/23/19 1229  AST 21  ALT 21  ALKPHOS 81  BILITOT 0.5  PROT 7.6  ALBUMIN 3.8  Recent Labs  Lab 06/23/19 1229  LIPASE 25   CBC: Recent Labs  Lab 06/23/19 1229 06/24/19 0352  WBC 8.8 9.3  HGB 12.1* 11.4*  HCT 37.1* 35.3*  MCV 91.8 92.7  PLT 431* 417*    CBG: Recent Labs  Lab 06/24/19 1148 06/24/19 1646 06/24/19 2016 06/24/19 2226 06/25/19 0606  GLUCAP 83 68* 118* 101* 102*    Signed:  Barton Dubois MD.  [facility] 06/25/2019, 12:59 PM

## 2019-06-27 ENCOUNTER — Other Ambulatory Visit: Payer: Self-pay | Admitting: *Deleted

## 2019-06-27 ENCOUNTER — Encounter: Payer: Self-pay | Admitting: *Deleted

## 2019-06-27 NOTE — Patient Outreach (Signed)
[facility]) Care [facility]  06/27/2019  Jim Sullivan 1955/12/07 [id number]   Transition of care call/case closure   Referral received:06/24/19 Initial outreach:06/27/19 Insurance: [employer]   Subjective: Initial successful telephone call to patient's preferred number in order to complete transition of care assessment; 2 HIPAA identifiers verified. Explained purpose of call and completed transition of care assessment.  Jim Sullivan states that he is exhausted but getting his strength back after hospital stay. He denies abdominal pain, no nausea or vomiting. He reports slowly progressing with diet introducing foods back in slowly. He is focusing on staying hydrated as recommended. He report having a soft bowel movement on today. Spouse is  assisting with his recovery.  Discussed  ongoing health issues of hypertension and Diabetes, he monitors blood pressures about once weekly and blood sugars more often and reading below 100. He feels that he is managing well and denies need for referral to Jim Sullivan health disease Sullivan program discussed. Marland Kitchen  He does not have the hospital indemnity He does [facility] outpatient pharmacy, [facility].  Objective:  Jim Sullivan  was hospitalized at [facility]  from 6/7-06/25/19 for Partial small bowel obstruction  Comorbidities include: Hypertension, Hyperlipidemia, history of  Meckel's diverticulum surgery.  He was discharged to home on 06/25/19 without the need for home health services or DME.   Assessment:  Patient voices good understanding of all discharge instructions.  See transition of care flowsheet for assessment details.   Plan:  Reviewed hospital discharge diagnosis of Partial small obstruction    and discharge treatment plan using hospital discharge instructions, assessing medication adherence, reviewing problems requiring provider notification, and discussing the importance of follow up with  surgeon, primary care provider and/or specialists as directed.  Reviewed [place] healthy lifestyle program information to receive discounted premium for  2022   Step 1: Get  your annual physical  Step 2: Complete your health assessment  Step 3:Identify your current health status and complete the corresponding action step between January 1, and September 17, 2019.    No ongoing care Sullivan needs identified so will close case to [place] [facility] services and route successful outreach letter with [place] Sullivan pamphlet and 24 Hour Nurse Line Magnet to Jim Sullivan clinical pool to be mailed to patient's home address.  Thanked patient for their services to Jim Sullivan.   Jim Draft, RN, BSN  [place] [facility] Coordinator  [phone]- Mobile [phone]- Toll Free Main Office

## 2019-07-02 ENCOUNTER — Telehealth: Payer: Self-pay | Admitting: Family Medicine

## 2019-07-02 ENCOUNTER — Ambulatory Visit: Payer: No Typology Code available for payment source | Admitting: Family Medicine

## 2019-07-02 NOTE — Telephone Encounter (Signed)
Needs TOC appointment?

## 2019-07-02 NOTE — Telephone Encounter (Signed)
Lmtcb.

## 2019-07-02 NOTE — Telephone Encounter (Signed)
Appt made

## 2019-07-03 NOTE — H&P (Signed)
[facility] History & Physical  Rob Doctor, hospital MD  Jim Sullivan 06/23/2019  Chief Complaint: Abd pain w/ nausea and vomiting HPI: The patient is a 64 y.o. year-old w/ hx of HTN, HL, DM2 on oral agents and h/o Meckel's diverticulum surgery presenting w/ nausea/ vomiting / diarrhea for the last 5 days. Having abd pains off and on as well.  Hasn't vomited the last 2 days and last diarrhea yesterday, but not eating now and says if he was trying to eat he would still be having diarrhea. W/u in ED showed no fevers, normal vital signs and labs show normal electrolytes, ^creat 2.1 (baseline 1.7) , normal LFT"S / albumin, WBC normal 8K and Hb 12 and uA negative.  CT abd w/ contrast showing SB dilatation and wall thickening w/ possible transition point, suspecting enteritis vs partial SBO.  Gen surg Dr Constance Haw was called , they recommended NG tube but pt refused. Asked to see for admission.   Pt states he got sick after a meal and had fevers and chills initially 5 days ago.  Since then abdomen has been "growling" and can't keep anything down , also having watery diarrhea after attempts to eat.  H/o Meckel's diverticulum surgery in the past. Also had hernia repair as a child and again as an adult.  Taking medication for HTN and HL and po meds for DM2.    ROS  denies CP  no joint pain   no HA  no blurry vision  no rash  no dysuria  no difficulty voiding  no change in urine color   Past Medical History  Past Medical History:  Diagnosis Date  . Diabetes mellitus without complication ([place])   . Erectile dysfunction   . GERD (gastroesophageal reflux disease)   . Hyperlipidemia   . Hypertension    Past Surgical History  Past Surgical History:  Procedure Laterality Date  . HERNIA REPAIR Right 2019  . HERNIA REPAIR Left infancy  . SMALL INTESTINE SURGERY  2013   Meckels Diverticulum    Family History History reviewed. No pertinent family history. Social History  reports that he quit  smoking about 6 years ago. His smoking use included cigarettes. He has a 15.00 pack-year smoking history. He has never used smokeless tobacco. No history on file for alcohol use and drug use. Allergies  Allergies  Allergen Reactions  . Morphine And Related Itching    Skin crawling   Home medications Prior to Admission medications   Medication Sig Start Date End Date Taking? Authorizing Provider  aspirin EC 81 MG tablet Take 81 mg by mouth every evening.    Yes [provider]  atorvastatin (LIPITOR) 20 MG tablet Take 1 tablet (20 mg total) by mouth daily. 05/22/19  Yes Claretta Fraise, MD  carvedilol (COREG) 25 MG tablet Take 1 tablet (25 mg total) by mouth 2 (two) times daily with a meal. 05/22/19  Yes Claretta Fraise, MD  hydrALAZINE (APRESOLINE) 50 MG tablet Take 1 tablet (50 mg total) by mouth 3 (three) times daily. 05/22/19  Yes Claretta Fraise, MD  metFORMIN (GLUCOPHAGE-XR) 500 MG 24 hr tablet Take 2 tablets (1,000 mg total) by mouth daily with breakfast. 05/22/19  Yes Stacks, Cletus Gash, MD  sitaGLIPtin (JANUVIA) 100 MG tablet Take 1 tablet (100 mg total) by mouth daily. Patient taking differently: Take 100 mg by mouth every evening.  05/22/19  Yes Claretta Fraise, MD  spironolactone (ALDACTONE) 25 MG tablet Take 1 tablet (25 mg total) by mouth daily.  05/22/19  Yes Claretta Fraise, MD  terazosin (HYTRIN) 5 MG capsule Take 1 capsule (5 mg total) by mouth at bedtime. 05/22/19  Yes Stacks, Cletus Gash, MD  Vitamin D, Ergocalciferol, (DRISDOL) 1.25 MG (50000 UNIT) CAPS capsule Take 1 capsule (50,000 Units total) by mouth every 7 (seven) days. 05/22/19  Yes Claretta Fraise, MD       Exam Gen alert mod obese, no distress, calm and pleasant No rash, cyanosis or gangrene Sclera anicteric, throat clear  No jvd or bruits Chest clear bilat to bases RRR no MRG Abd soft ntnd no mass or ascites +bs, ^'d BS GU normal male MS no joint effusions or deformity Ext no LE edema, no wounds or ulcers Neuro is alert,  Ox 3 , nf     Home meds:  - asa 81/ lipitor 20 / coreg 25 bid/ hydralazine 50 tid/ aldactone 25 qd  - terazosin 5 hs  - sitagliptin 100 qd/ metformin-xr 2 gm qam  - prn's/ vitamins/ supplements    B/l creat 1.5- 1.7 in mid 2020   Na 136  K 4.3 OC2 23 BUN 29  Creat 2.11  eGFR 32     WBC 8k  Hb 12  plt 431  UA 100 prot, 0-5 rbc/ wbc, clear    CT abd/ pelv w/ contrast > IMPRESSION: 1. Mild small bowel dilatation and wall thickening, with transition point at site of surgical staples in anterior right abdomen. This is suspicious for enteritis and partial small bowel obstruction. 2. Sigmoid diverticulosis, without radiographic evidence of diverticulitis.     HR 68  RR 18- 20  Temp 98.7  BP 166/73     Assessment/ Plan: 1. Nausea/ vomiting /diarrhea/ abd pain - enteritis vs partial SBO.  Gen surg called and recommended NG , but pt refused NG.  Admit, IVF"s, npo for now except for BP meds. Pain meds and nausea meds prn.  2. HTN - take BP pills w/ sips if tolerated, if not can give schedule IV metop or some other IV medication.  3. AoCKD3 - due to dehydration most likely, got NS bolus and getting IVF's now. Recheck in am. Got IV contrast for abd CT as well.  4. DM2 - using q4h SSI while here 5. HL - resume meds when eating     Kelly Splinter, MD   [facility] 06/23/2019, 6:36 PM

## 2019-07-08 ENCOUNTER — Ambulatory Visit (INDEPENDENT_AMBULATORY_CARE_PROVIDER_SITE_OTHER): Payer: No Typology Code available for payment source | Admitting: Family

## 2019-07-08 ENCOUNTER — Encounter: Payer: Self-pay | Admitting: Family

## 2019-07-08 ENCOUNTER — Ambulatory Visit (INDEPENDENT_AMBULATORY_CARE_PROVIDER_SITE_OTHER): Payer: No Typology Code available for payment source

## 2019-07-08 ENCOUNTER — Other Ambulatory Visit: Payer: Self-pay

## 2019-07-08 VITALS — BP 139/79 | HR 68 | Temp 96.6°F | Ht 69.0 in | Wt 225.4 lb

## 2019-07-08 DIAGNOSIS — K56600 Partial intestinal obstruction, unspecified as to cause: Secondary | ICD-10-CM | POA: Diagnosis not present

## 2019-07-08 DIAGNOSIS — N1831 Chronic kidney disease, stage 3a: Secondary | ICD-10-CM | POA: Diagnosis not present

## 2019-07-08 DIAGNOSIS — E119 Type 2 diabetes mellitus without complications: Secondary | ICD-10-CM | POA: Diagnosis not present

## 2019-07-08 DIAGNOSIS — N179 Acute kidney failure, unspecified: Secondary | ICD-10-CM | POA: Diagnosis not present

## 2019-07-08 DIAGNOSIS — Z09 Encounter for follow-up examination after completed treatment for conditions other than malignant neoplasm: Secondary | ICD-10-CM

## 2019-07-08 LAB — CMP14+EGFR
ALT: 14 IU/L (ref 0–44)
AST: 16 IU/L (ref 0–40)
Albumin/Globulin Ratio: 1.6 (ref 1.2–2.2)
Albumin: 4.5 g/dL (ref 3.8–4.8)
Alkaline Phosphatase: 121 IU/L (ref 48–121)
BUN/Creatinine Ratio: 16 (ref 10–24)
BUN: 30 mg/dL — ABNORMAL HIGH (ref 8–27)
Bilirubin Total: 0.5 mg/dL (ref 0.0–1.2)
CO2: 21 mmol/L (ref 20–29)
Calcium: 9.8 mg/dL (ref 8.6–10.2)
Chloride: 103 mmol/L (ref 96–106)
Creatinine, Ser: 1.85 mg/dL — ABNORMAL HIGH (ref 0.76–1.27)
GFR calc Af Amer: 44 mL/min/{1.73_m2} — ABNORMAL LOW (ref >59)
GFR calc non Af Amer: 38 mL/min/{1.73_m2} — ABNORMAL LOW (ref >59)
Globulin, Total: 2.9 g/dL (ref 1.5–4.5)
Glucose: 158 mg/dL — ABNORMAL HIGH (ref 65–99)
Potassium: 5.6 mmol/L — ABNORMAL HIGH (ref 3.5–5.2)
Sodium: 137 mmol/L (ref 134–144)
Total Protein: 7.4 g/dL (ref 6.0–8.5)

## 2019-07-08 LAB — CBC WITH DIFFERENTIAL/PLATELET
Basophils Absolute: 0.1 10*3/uL (ref 0.0–0.2)
Basos: 1 %
EOS (ABSOLUTE): 0.2 10*3/uL (ref 0.0–0.4)
Eos: 3 %
Hematocrit: 37.2 % — ABNORMAL LOW (ref 37.5–51.0)
Hemoglobin: 12.5 g/dL — ABNORMAL LOW (ref 13.0–17.7)
Immature Grans (Abs): 0 10*3/uL (ref 0.0–0.1)
Immature Granulocytes: 0 %
Lymphocytes Absolute: 1.3 10*3/uL (ref 0.7–3.1)
Lymphs: 16 %
MCH: 30.9 pg (ref 26.6–33.0)
MCHC: 33.6 g/dL (ref 31.5–35.7)
MCV: 92 fL (ref 79–97)
Monocytes Absolute: 0.8 10*3/uL (ref 0.1–0.9)
Monocytes: 9 %
Neutrophils Absolute: 5.7 10*3/uL (ref 1.4–7.0)
Neutrophils: 71 %
Platelets: 513 10*3/uL — ABNORMAL HIGH (ref 150–450)
RBC: 4.05 x10E6/uL — ABNORMAL LOW (ref 4.14–5.80)
RDW: 13 % (ref 11.6–15.4)
WBC: 8.1 10*3/uL (ref 3.4–10.8)

## 2019-07-08 NOTE — Progress Notes (Signed)
Subjective:    Patient ID: Jim Sullivan, male    DOB: [birth year], 64 y.o.   MRN: [id number]  Chief Complaint  Patient presents with  . Hospitalization Follow-up    stomach feeling like a pit this morning. Burping a lot and has diahrrea with nausea. Last normal BM was yesterday. Wants referral!    HPI Pt presents to the office today for hospital follow up. He went to the ED on 06/23/19 and discharged 06/25/19 with a SBO. He went with h/o Meckel's diverticulum surgery presenting w/ nausea/ vomiting / diarrhea for  5 days.  He had acute kidney injury on CKD stage 3 in setting of dehydration.   He reports he was feeling better and has been eating a soft diet and tolerating well. However, this morning he feels more pressure,  burping, gas, and diarrhea.    Review of Systems  All other systems reviewed and are negative.      Objective:   Physical Exam Vitals reviewed.  Constitutional:      General: He is not in acute distress.    Appearance: He is well-developed.  HENT:     Head: Normocephalic.     Right Ear: Tympanic membrane normal.     Left Ear: Tympanic membrane normal.  Eyes:     General:        Right eye: No discharge.        Left eye: No discharge.     Pupils: Pupils are equal, round, and reactive to light.  Neck:     Thyroid: No thyromegaly.  Cardiovascular:     Rate and Rhythm: Normal rate and regular rhythm.     Heart sounds: Normal heart sounds. No murmur heard.   Pulmonary:     Effort: Pulmonary effort is normal. No respiratory distress.     Breath sounds: Normal breath sounds. No wheezing.  Abdominal:     General: Bowel sounds are normal. There is no distension.     Palpations: Abdomen is soft.     Tenderness: There is no abdominal tenderness (no tenderness noted).  Musculoskeletal:        General: No tenderness. Normal range of motion.     Cervical back: Normal range of motion and neck supple.  Skin:    General: Skin is warm and dry.     Findings:  No erythema or rash.  Neurological:     Mental Status: He is alert and oriented to person, place, and time.     Cranial Nerves: No cranial nerve deficit.     Deep Tendon Reflexes: Reflexes are normal and symmetric.  Psychiatric:        Behavior: Behavior normal.        Thought Content: Thought content normal.        Judgment: Judgment normal.       BP 139/79   Pulse 68   Temp (!) 96.6 F (35.9 C) (Temporal)   Ht 5' 9"  (1.753 m)   Wt 225 lb 6.4 oz (102.2 kg)   SpO2 99%   BMI 33.29 kg/m      Assessment & Plan:  Jim Sullivan comes in today with chief complaint of Hospitalization Follow-up (stomach feeling like a pit this morning. Burping a lot and has diahrrea with nausea. Last normal BM was yesterday. Wants referral!)   Diagnosis and orders addressed:  1. Hospital discharge follow-up - Ambulatory referral to Gastroenterology - CMP14+EGFR - CBC with Differential/Platelet  2. Partial small bowel obstruction ([place]) - Ambulatory  referral to Gastroenterology - CMP14+EGFR - CBC with Differential/Platelet - DG Abd 1 View; Future  3. Type 2 diabetes mellitus without complication, without long-term current use of insulin ([place]) - Ambulatory referral to Ophthalmology - CMP14+EGFR - CBC with Differential/Platelet  4. Acute renal failure superimposed on stage 3a chronic kidney disease, unspecified acute renal failure type ([place]) - CMP14+EGFR - CBC with Differential/Platelet   Full liquid diet Keep GI follow up If pain worsens or N&V go back to ED Does not want zofran today Labs pending   Evelina Dun, FNP

## 2019-07-08 NOTE — Patient Instructions (Signed)
Bowel Obstruction A bowel obstruction is a blockage in the small or large bowel. The bowel, which is also called the intestine, is a long, slender tube that connects the stomach to the anus. When a person eats and drinks, food and fluids go from the mouth to the stomach to the small bowel. This is where most of the nutrients in the food and fluids are absorbed. After the small bowel, material passes through the large bowel for further absorption until any leftover material leaves the body as stool through the anus during a bowel movement. A bowel obstruction will prevent food and fluids from passing through the bowel as they normally do during digestion. The bowel can become partially or completely blocked. If this condition is not treated, it can be dangerous because the bowel could rupture. What are the causes? Common causes of this condition include:  Scar tissue (adhesions) from previous surgery or treatment with high-energy X-rays (radiation).  Recent surgery. This may cause the movements of the bowel to slow down and cause food to block the intestine.  Inflammatory bowel disease, such as Crohn's disease or diverticulitis.  Growths or tumors.  A bulging organ (hernia).  Twisting of the bowel (volvulus).  A foreign body.  Slipping of a part of the bowel into another part (intussusception). What are the signs or symptoms? Symptoms of this condition include:  Pain in the abdomen. Depending on the degree of obstruction, pain may be: ? Mild or severe. ? Dull cramping or sharp pain. ? In one area or in the entire abdomen.  Nausea and vomiting. Vomit may be greenish or a yellow bile color.  Bloating in the abdomen.  Difficulty passing stool (constipation).  Lack of passing gas.  Frequent belching.  Diarrhea. This may occur if the obstruction is partial and runny stool is able to leak around the obstruction. How is this diagnosed? This condition may be diagnosed based on:  A  physical exam.  Medical history.  Imaging tests of the abdomen or pelvis, such as X-ray or CT scan.  Blood or urine tests. How is this treated? Treatment for this condition depends on the cause and severity of the problem. Treatment may include:  Fluids and pain medicines that are given through an IV. Your health care provider may instruct you not to eat or drink if you have nausea or vomiting.  Eating a simple diet. You may be asked to consume a clear liquid diet for several days. This allows the bowel to rest.  Placement of a small tube (nasogastric tube) into the stomach. This will relieve pain, discomfort, and nausea by removing blocked air and fluids from the stomach. It can also help the obstruction clear up faster.  Surgery. This may be required if other treatments do not work. Surgery may be required for: ? Bowel obstruction from a hernia. This can be an emergency procedure. ? Scar tissue that causes frequent or severe obstructions. Follow these instructions at home: Medicines  Take over-the-counter and prescription medicines only as told by your health care provider.  If you were prescribed an antibiotic medicine, take it as told by your health care provider. Do not stop taking the antibiotic even if you start to feel better. General instructions  Follow instructions from your health care provider about eating restrictions. You may need to avoid solid foods and consume only clear liquids until your condition improves.  Return to your normal activities as told by your health care provider. Ask your health care   provider what activities are safe for you.  Avoid sitting for a long time without moving. Get up to take short walks every 1-2 hours. This is important to improve blood flow and breathing. Ask for help if you feel weak or unsteady.  Keep all follow-up visits as told by your health care provider. This is important. How is this prevented? After having a bowel  obstruction, you are more likely to have another. You may do the following things to prevent another obstruction:  If you have a long-term (chronic) disease, pay attention to your symptoms and contact your health care provider if you have questions or concerns.  Avoid becoming constipated. To prevent or treat constipation, your health care provider may recommend that you: ? Drink enough fluid to keep your urine pale yellow. ? Take over-the-counter or prescription medicines. ? Eat foods that are high in fiber, such as beans, whole grains, and fresh fruits and vegetables. ? Limit foods that are high in fat and processed sugars, such as fried or sweet foods.  Stay active. Exercise for 30 minutes or more, 5 or more days each week. Ask your health care provider which exercises are safe for you.  Avoid stress. Find ways to reduce stress, such as meditation, exercise, or taking time for activities that relax you.  Instead of eating three large meals each day, eat three small meals with three small snacks.  Work with a dietitian to make a healthy meal plan that works for you.  Do not use any products that contain nicotine or tobacco, such as cigarettes and e-cigarettes. If you need help quitting, ask your health care provider. Contact a health care provider if you:  Have a fever.  Have chills. Get help right away if you:  Have increased pain or cramping.  Vomit blood.  Have uncontrolled vomiting or nausea.  Cannot drink fluids because of vomiting or pain.  Become confused.  Begin feeling very thirsty (dehydrated).  Have severe bloating.  Feel extremely weak or you faint. Summary  A bowel obstruction is a blockage in the small or large bowel.  A bowel obstruction will prevent food and fluids from passing through the bowel as they normally do during digestion.  Treatment for this condition depends on the cause and severity of the problem. It may include fluids and pain medicines  through an IV, a simple diet, a nasogastric tube, or surgery.  Follow instructions from your health care provider about eating restrictions. You may need to avoid solid foods and consume only clear liquids until your condition improves. This information is not intended to replace advice given to you by your health care provider. Make sure you discuss any questions you have with your health care provider. Document Revised: 02/08/2018 Document Reviewed: 05/16/2017 Elsevier Patient Education  2020 [employer]  

## 2019-07-10 ENCOUNTER — Encounter (INDEPENDENT_AMBULATORY_CARE_PROVIDER_SITE_OTHER): Payer: Self-pay

## 2019-07-11 ENCOUNTER — Other Ambulatory Visit: Payer: Self-pay | Admitting: Family

## 2019-07-11 ENCOUNTER — Encounter: Payer: Self-pay | Admitting: Family Medicine

## 2019-07-11 DIAGNOSIS — E875 Hyperkalemia: Secondary | ICD-10-CM

## 2019-07-11 DIAGNOSIS — R7989 Other specified abnormal findings of blood chemistry: Secondary | ICD-10-CM

## 2019-07-14 ENCOUNTER — Encounter: Payer: Self-pay | Admitting: *Deleted

## 2019-07-14 ENCOUNTER — Ambulatory Visit (INDEPENDENT_AMBULATORY_CARE_PROVIDER_SITE_OTHER): Payer: No Typology Code available for payment source | Admitting: Internal Medicine

## 2019-07-14 ENCOUNTER — Other Ambulatory Visit: Payer: Self-pay | Admitting: *Deleted

## 2019-07-14 ENCOUNTER — Encounter (INDEPENDENT_AMBULATORY_CARE_PROVIDER_SITE_OTHER): Payer: Self-pay | Admitting: Internal Medicine

## 2019-07-14 ENCOUNTER — Other Ambulatory Visit: Payer: No Typology Code available for payment source

## 2019-07-14 ENCOUNTER — Other Ambulatory Visit: Payer: Self-pay

## 2019-07-14 DIAGNOSIS — D649 Anemia, unspecified: Secondary | ICD-10-CM

## 2019-07-14 DIAGNOSIS — K9189 Other postprocedural complications and disorders of digestive system: Secondary | ICD-10-CM | POA: Diagnosis not present

## 2019-07-14 DIAGNOSIS — E875 Hyperkalemia: Secondary | ICD-10-CM

## 2019-07-14 DIAGNOSIS — K6389 Other specified diseases of intestine: Secondary | ICD-10-CM | POA: Diagnosis not present

## 2019-07-14 MED ORDER — METRONIDAZOLE 250 MG PO TABS: 250.0000 mg | ORAL_TABLET | Freq: Three times a day (TID) | ORAL | 0 refills | Status: DC

## 2019-07-14 MED FILL — metroNIDAZOLE 250 MG TABS: 250 | 10 days supply | Qty: 30 | Fill #0

## 2019-07-14 NOTE — Addendum Note (Signed)
Addended by: Milas Hock on: 07/14/2019 09:18 AM   Modules accepted: Orders

## 2019-07-14 NOTE — Progress Notes (Signed)
Reason for consultation  Intermittent abdominal pain bloating and diarrhea. Recent hospitalization for small bowel obstruction.  History of present illness  Jim Sullivan is 63 year old Caucasian male who is referred through courtesy of Ms. Evelina Dun FNP/Warren Stacks, MD for GI evaluation. Patient reports having exploratory laparotomy in 2013(he was living in [place]) for recurrent episodes of small bowel obstruction when he was found to have Meckel's diverticulum.  He required resection of small segment of small bowel.  Patient recalls that prior to surgery he had 6 or 7 episodes of small bowel obstruction and had undergone multiple studies which were nonrevealing.  Following this surgery he did fine for several years until about 5 months ago when he had similar symptomatology.  He remembers experiencing sweating cold chills progressive abdominal distention pain followed by vomiting and he also experienced diarrhea.  He gradually improve over the next 2 days.  He had another episode earlier this month when he experienced abdominal bloating pain nausea vomiting and diarrhea which continued for 5 days and eventually came to emergency room on 06/23/2019 when he was felt to have small bowel obstruction most likely due to anastomotic stricture.  The patient responded to medical therapy.  He was seen in consultation by Dr. Constance Haw of surgical service.  Patient was discharged 2 days later. Patient states that since then he has been on very limited diet but he has been able to advance his diet over the last 2 days.  He states he had another episode last week.  He felt bloated nauseated and began to have loose watery stools.  He had to leave work.  He also developed poor appetite.  He said on Pedialyte and Gatorade for few days.  He lost about 5 pounds.  He was seen by Ms. Evelina Dun FNP on 07/08/2019 who obtained abdominal film which revealed dilated loops of small and large bowel.  Therefore GI consultation was  requested.  Patient says he has been feeling better over the last 2 days.  His appetite is coming back.  His abdominal bloating and pain is resolved and he is not passing mushy stools instead of loose watery stools.  He is also drinking protein shakes and fiber drinks.  These episodes are not associated with fever hematemesis melena or rectal bleeding.  In hindsight he feels he may have had 2 mild episodes last year that were short-lived any did not pay much attention. He does not take OTC NSAIDs.  His last colonoscopy was several years ago and he is due for one now. He reports worsening renal function in the setting of dehydration.  He had blood work earlier today but is not back yet. He denies heartburn or dysphagia.  Current Medications: Outpatient Encounter Medications as of 07/14/2019  Medication Sig  . aspirin EC 81 MG tablet Take 81 mg by mouth every evening.   Marland Kitchen atorvastatin (LIPITOR) 20 MG tablet Take 1 tablet (20 mg total) by mouth daily.  . carvedilol (COREG) 25 MG tablet Take 1 tablet (25 mg total) by mouth 2 (two) times daily with a meal.  . hydrALAZINE (APRESOLINE) 50 MG tablet Take 1 tablet (50 mg total) by mouth 3 (three) times daily.  . metFORMIN (GLUCOPHAGE-XR) 500 MG 24 hr tablet Take 2 tablets (1,000 mg total) by mouth daily with breakfast.  . sitaGLIPtin (JANUVIA) 100 MG tablet Take 1 tablet (100 mg total) by mouth daily. (Patient taking differently: Take 100 mg by mouth every evening. )  . spironolactone (ALDACTONE) 25 MG tablet  Take 1 tablet (25 mg total) by mouth daily.  Marland Kitchen terazosin (HYTRIN) 5 MG capsule Take 1 capsule (5 mg total) by mouth at bedtime.  . Vitamin D, Ergocalciferol, (DRISDOL) 1.25 MG (50000 UNIT) CAPS capsule Take 1 capsule (50,000 Units total) by mouth every 7 (seven) days.   No facility-administered encounter medications on file as of 07/14/2019.   Past medical history  Obesity.  In 2010 he weighed close to 300 pounds and managed to lose 80 pounds and he  has maintained his weight at current level since then. Hypertension of about 18 years duration . Diabetes mellitus since 2005. History of heart murmur.  He had an ECHO few years ago and it did not show anything significant Hyperlipidemia.  He he was on medication few years ago but it was discontinued and he has been back on statin for 2 years. Vitamin D deficiency dating back to April 2020.  Vitamin D level in May 2021 was 51.4.  Level was 20.8 in April 2020. Surgery for undescended left testes at age 81. Laparotomy in 2013 for recurrent bouts of small bowel when he was discovered to have Meckel's diverticulum and required resection of short segment of small bowel. Right inguinal herniorrhaphy in 2018.  Allergies  Allergies  Allergen Reactions  . Morphine And Related Itching    Skin crawling    Family history  Both parents are living.  His father has hypertension.  They are both 44 years old. He has a sister age 5 who has MS which was diagnosed 14 years ago and she is doing very poorly. Maternal grandmother died of leukemia at age 7.  It was diagnosed about 10 years earlier.  Social history  He is married.  He has 3 stepsons and 1 adopted daughter.  He served in [employer] from [phone] he has been in healthcare system since 1995.  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  He will drink beer occasionally.  He smokes cigarettes for about 14 years less than a pack a day.  He quit 25 years ago. He says he had his wife walk regularly for health reasons.  Physical examination  Blood pressure (!) 163/80, pulse 82, temperature 99.3 F (37.4 C), temperature source Oral, height 5' 9" (1.753 m), weight 225 lb 6.4 oz (102.2 kg). Patient is alert and in no acute distress. Conjunctiva is pink. Sclera is nonicteric Oropharyngeal mucosa is normal. Few of the upper molars are missing.  Dentition in satisfactory condition. No neck masses or thyromegaly noted. Cardiac exam  with regular rhythm normal S1 and S2.  He has grade 2/6 systolic murmur best heard at aortic area. Lungs are clear to auscultation. Abdomen is full.  Bowel sounds are normal.  On palpation it is soft and nontender.  Percussion note is sympathetic across upper abdomen.  No organomegaly or masses. No LE edema or clubbing noted.  Labs/studies Results:  CBC Latest Ref Rng & Units 07/08/2019 06/24/2019 06/23/2019  WBC 3.4 - 10.8 x10E3/uL 8.1 9.3 8.8  Hemoglobin 13.0 - 17.7 g/dL 12.5(L) 11.4(L) 12.1(L)  Hematocrit 37.5 - 51.0 % 37.2(L) 35.3(L) 37.1(L)  Platelets 150 - 450 x10E3/uL 513(H) 417(H) 431(H)    CMP Latest Ref Rng & Units 07/08/2019 06/25/2019 06/24/2019  Glucose 65 - 99 mg/dL 158(H) 107(H) 93  BUN 8 - 27 mg/dL 30(H) 20 28(H)  Creatinine 0.76 - 1.27 mg/dL 1.85(H) 1.67(H) 1.88(H)  Sodium 134 - 144 mmol/L 137 137 138  Potassium 3.5 -  5.2 mmol/L 5.6(H) 4.0 3.9  Chloride 96 - 106 mmol/L 103 106 105  CO2 20 - 29 mmol/L 21 22 21(L)  Calcium 8.6 - 10.2 mg/dL 9.8 8.6(L) 8.7(L)  Total Protein 6.0 - 8.5 g/dL 7.4 - -  Total Bilirubin 0.0 - 1.2 mg/dL 0.5 - -  Alkaline Phos 48 - 121 IU/L 121 - -  AST 0 - 40 IU/L 16 - -  ALT 0 - 44 IU/L 14 - -    Hepatic Function Latest Ref Rng & Units 07/08/2019 06/23/2019 05/22/2019  Total Protein 6.0 - 8.5 g/dL 7.4 7.6 7.2  Albumin 3.8 - 4.8 g/dL 4.5 3.8 4.2  AST 0 - 40 IU/L _0 ALT 0 - 44 IU/L _1 Alk Phosphatase 48 - 121 IU/L 121 81 128(H)  Total Bilirubin 0.0 - 1.2 mg/dL 0.5 0.5 0.3    I reviewed abdominopelvic CT from 06/23/2019 and abdominal film from 06/24/2019 and 07/08/2019 with Dr. Thornton Papas. CT revealed mildly dilated loops of small bowel to the level of anastomosis and small bowel loop distal to anastomosis of normal caliber.  There appeared to be mild wall thickening. Abdominal film from 06/24/2019 revealed contrast throughout the colon. Plain abdominal film revealed diffuse dilation of small and large bowel.   Assessment:  #1.  Recurrent spells of  bloating nausea vomiting abdominal pain and diarrhea most likely due to partial small bowel obstruction.  It is highly unlikely that these episodes are due to gastroenteritis.  He may also have small intestinal bacterial overgrowth if indeed he has significant anastomotic narrowing or stricture.  He needs to be further evaluated to determine if he has high-grade stricture which may require surgical intervention.  #2.  Anemia.  No history of GI bleed.  Anemia possibly due to acute illness.  Will repeat CBC in near future.  If H&H is low will consider iron studies B12 and folate levels.  Recommendations  Patient advised to gradually advance diet to low-salt diet over the next 2 days. Metronidazole 250 mg by mouth after each meal for total of 10 days. Will arrange for small bowel follow-through in 2 weeks. Screening colonoscopy to be scheduled when acute illness addressed and resolved.

## 2019-07-14 NOTE — Patient Instructions (Signed)
Keep daily symptom diary. Please call office if you experience abdominal pain distention or vomiting. Plan small bowel follow-through in about 2 weeks once you finish antibiotic.

## 2019-07-15 ENCOUNTER — Other Ambulatory Visit: Payer: Self-pay | Admitting: Family

## 2019-07-15 ENCOUNTER — Other Ambulatory Visit (INDEPENDENT_AMBULATORY_CARE_PROVIDER_SITE_OTHER): Payer: Self-pay | Admitting: *Deleted

## 2019-07-15 DIAGNOSIS — K9189 Other postprocedural complications and disorders of digestive system: Secondary | ICD-10-CM

## 2019-07-15 DIAGNOSIS — N185 Chronic kidney disease, stage 5: Secondary | ICD-10-CM

## 2019-07-15 LAB — BMP8+EGFR
BUN/Creatinine Ratio: 19 (ref 10–24)
BUN: 35 mg/dL — ABNORMAL HIGH (ref 8–27)
CO2: 21 mmol/L (ref 20–29)
Calcium: 9.6 mg/dL (ref 8.6–10.2)
Chloride: 103 mmol/L (ref 96–106)
Creatinine, Ser: 1.87 mg/dL — ABNORMAL HIGH (ref 0.76–1.27)
GFR calc Af Amer: 43 mL/min/{1.73_m2} — ABNORMAL LOW (ref >59)
GFR calc non Af Amer: 37 mL/min/{1.73_m2} — ABNORMAL LOW (ref >59)
Glucose: 143 mg/dL — ABNORMAL HIGH (ref 65–99)
Potassium: 5.3 mmol/L — ABNORMAL HIGH (ref 3.5–5.2)
Sodium: 141 mmol/L (ref 134–144)

## 2019-07-16 ENCOUNTER — Ambulatory Visit (INDEPENDENT_AMBULATORY_CARE_PROVIDER_SITE_OTHER): Payer: No Typology Code available for payment source | Admitting: Internal Medicine

## 2019-07-22 LAB — HM DIABETES EYE EXAM

## 2019-07-28 MED FILL — JANUVIA 100 MG TABLET: 100 | 30 days supply | Qty: 30 | Fill #2

## 2019-07-29 ENCOUNTER — Other Ambulatory Visit: Payer: Self-pay

## 2019-07-29 ENCOUNTER — Ambulatory Visit (HOSPITAL_COMMUNITY)
Admission: RE | Admit: 2019-07-29 | Discharge: 2019-07-29 | Disposition: A | Discharge status: Home / Self Care | Payer: No Typology Code available for payment source | Source: Ambulatory Visit | Attending: Internal Medicine | Admitting: Internal Medicine

## 2019-07-29 DIAGNOSIS — K9189 Other postprocedural complications and disorders of digestive system: Secondary | ICD-10-CM | POA: Insufficient documentation

## 2019-08-13 ENCOUNTER — Telehealth: Payer: No Typology Code available for payment source | Admitting: Nurse Practitioner

## 2019-08-13 ENCOUNTER — Encounter: Payer: Self-pay | Admitting: Family Medicine

## 2019-08-13 DIAGNOSIS — L551 Sunburn of second degree: Secondary | ICD-10-CM

## 2019-08-13 NOTE — Progress Notes (Signed)
We are sorry that you are not feeling well.  Here is how we plan to help!  Based on what you have shared with me, I'd like to share with you a treatment plan for sunburn.   Most sunburn is a first degree burn that turns the skin pink or red.  It can be painful to touch.  If you stayed in the sun for a prolonged period this might have progressed to a second degree burn with blistering!  Usually the pain and swelling starts after about 4 hours, peaks at 24 hours and begins to improve after 48 hours or about 2 days.  REMEMBER prolonged exposure to the sun increases your risk of skin cancer so use sunscreen before you go outside!  We will give you more information about sunscreen use later in your care plan.  Your sunburn can be managed by self-care at home.  Please use the following care guide to manage your sunburn.  If you symptoms worsen, you have other questions or concerns, or you develop any of the warnings signs listed in your care plan you will need to seek a face to face visit with a provider without waiting!  Home Care Advice for Treating Mild Sunburn:  1. Take Ibuprofen (Advil, Motrin) for pain relief as soon as possible.  The adult dosage is up to 600 mg every 6 hours.  Starting within 6 hours of sun exposure may greatly reduce your discomfort.  If you cannot take Ibuprofen you may use Acetaminophen instead.  Do not take Ibuprofen if you have stomach problems, kidney disease or are pregnant.  Do not take Ibuprofen if you have been told by your doctor or pharmacist to avoid this class of drugs.  Do not take Acetaminophen if you have liver disease.  Read the package warnings on any medication that you take!  2.  Use a steroid cream on the affected skin.  If you apply an over the counter steroid       cream as soon as possible and repeat it three times a day it may reduce the pain and      and swelling.  Until you get the steroid cream you may start with a moistening cream      cream  or aloe gel.  3. For second or third degree sunburn with painful blistering, you can use an over the         counter product Burn Jel Plus Pain Relieving Gel. Apply in a thick even layer over the     affected area not more than 3 to 4 times daily If you need to cover the area to protect      it from friction of clothing, you can use Moist Burn Pads such as Hydrogel Burn Pads       which are available over the counter.  4.  Apply cool compresses to the burned areas several times a day.  5.  Avoid soap on the sunburned areas.  6.  Drink plenty of water.  It is easy to get dehydrated from prolong time in the sun      Outdoors.  7.  For any broken blisters:  Trim off the dead skin with fine scissors.  It is wise to clean the scissor with alcohol before use.  Apply antibiotic ointments to the blister.  Apply twice a day for three days.  There are triple antibiotic ointments with topical pain relievers available at stores.  Caution:leave intact blisters alone.  They are protecting the skin and will allow it to heal.  8.  Taking Vitamin C orally may reduce sun damage to your skin.  Follow the      Instructions on the bottle.  The recommended adult dosage is 2 grams.  What to Expect:  1. Pain usually stops after 2 or 3 days. 2. Skin flaking and peeling usually occurs for 3-7 days after a sunburn.  Call your provider if:  1. You feel very weak or have difficulty standing. 2. Blister develops on your face. 3. You become sensitive to light because of eye pain. 4. Your skin looks infected (red streaks, puss or worsening tenderness after 48 hours. 5. You feel you should be seen.  Preventing Sunburns:  1. Apply 20-30 SPF sunscreen to your skin before going into the sun. 2. Reapply every 2-4 hours or after sweating or swimming. 3. Sunscreens protect from sunburns but do not completely prevent skin damage.  Nancy Fetter exposure still increases your risk of premature aging and skin cancers.  Your  e-visit answers were reviewed by a board certified advanced clinical practitioner to complete your personal care plan.  Depending on the condition, your plan could have included both over the counter or prescription medications.  If there is a problem please reply  once you have received a response from your provider.  Your safety is important to [place].  If you have drug allergies check your prescription carefully.    You can use MyChart to ask questions about today's visit, request a non-urgent call back, or ask for a work or school excuse for 24 hours related to this e-Visit. If it has been greater than 24 hours you will need to follow up with your provider, or enter a new e-Visit to address those concerns.  You will get an e-mail in the next two days asking about your experience.  I hope that your e-visit has been valuable and will speed your recovery. Thank you for using e-visits.    5-10 minutes spent reviewing and documenting in chart.

## 2019-08-14 ENCOUNTER — Other Ambulatory Visit: Payer: Self-pay | Admitting: Family Medicine

## 2019-08-14 MED ORDER — PREDNISONE 10 MG PO TABS
ORAL_TABLET | ORAL | 0 refills | Status: DC
Start: 2019-08-14 — End: 2019-09-17

## 2019-08-14 MED FILL — predniSONE 10 MG TABS: 10 | 10 days supply | Qty: 30 | Fill #0

## 2019-08-26 ENCOUNTER — Other Ambulatory Visit: Payer: Self-pay | Admitting: Family Medicine

## 2019-08-26 DIAGNOSIS — E1169 Type 2 diabetes mellitus with other specified complication: Secondary | ICD-10-CM

## 2019-08-26 MED FILL — VIT D2 1.25 MG (50,000 UNIT: 1.25 MG | 84 days supply | Qty: 12 | Fill #1

## 2019-08-26 MED FILL — JANUVIA 100 MG TABLET: 100 | 30 days supply | Qty: 30 | Fill #0

## 2019-08-26 MED FILL — TERAZOSIN 5 MG CAPSULE: 5 | 90 days supply | Qty: 90 | Fill #1

## 2019-08-26 MED FILL — ATORVASTATIN 20 MG TABLET: 20 | 90 days supply | Qty: 90 | Fill #1

## 2019-09-17 ENCOUNTER — Other Ambulatory Visit: Payer: Self-pay

## 2019-09-17 ENCOUNTER — Ambulatory Visit (INDEPENDENT_AMBULATORY_CARE_PROVIDER_SITE_OTHER): Payer: No Typology Code available for payment source | Admitting: Family Medicine

## 2019-09-17 ENCOUNTER — Encounter: Payer: Self-pay | Admitting: Family Medicine

## 2019-09-17 ENCOUNTER — Other Ambulatory Visit: Payer: Self-pay | Admitting: Family Medicine

## 2019-09-17 VITALS — BP 183/85 | HR 65 | Temp 97.7°F | Resp 20 | Ht 69.0 in | Wt 226.2 lb

## 2019-09-17 DIAGNOSIS — E785 Hyperlipidemia, unspecified: Secondary | ICD-10-CM | POA: Diagnosis not present

## 2019-09-17 DIAGNOSIS — E119 Type 2 diabetes mellitus without complications: Secondary | ICD-10-CM

## 2019-09-17 DIAGNOSIS — Z23 Encounter for immunization: Secondary | ICD-10-CM | POA: Diagnosis not present

## 2019-09-17 DIAGNOSIS — I10 Essential (primary) hypertension: Secondary | ICD-10-CM

## 2019-09-17 LAB — CMP14+EGFR
ALT: 13 IU/L (ref 0–44)
AST: 16 IU/L (ref 0–40)
Albumin/Globulin Ratio: 1.4 (ref 1.2–2.2)
Albumin: 4.1 g/dL (ref 3.8–4.8)
Alkaline Phosphatase: 110 IU/L (ref 48–121)
BUN/Creatinine Ratio: 17 (ref 10–24)
BUN: 29 mg/dL — ABNORMAL HIGH (ref 8–27)
Bilirubin Total: 0.3 mg/dL (ref 0.0–1.2)
CO2: 23 mmol/L (ref 20–29)
Calcium: 9.4 mg/dL (ref 8.6–10.2)
Chloride: 100 mmol/L (ref 96–106)
Creatinine, Ser: 1.75 mg/dL — ABNORMAL HIGH (ref 0.76–1.27)
GFR calc Af Amer: 47 mL/min/{1.73_m2} — ABNORMAL LOW (ref >59)
GFR calc non Af Amer: 41 mL/min/{1.73_m2} — ABNORMAL LOW (ref >59)
Globulin, Total: 3 g/dL (ref 1.5–4.5)
Glucose: 183 mg/dL — ABNORMAL HIGH (ref 65–99)
Potassium: 5.7 mmol/L — ABNORMAL HIGH (ref 3.5–5.2)
Sodium: 135 mmol/L (ref 134–144)
Total Protein: 7.1 g/dL (ref 6.0–8.5)

## 2019-09-17 LAB — LIPID PANEL
Chol/HDL Ratio: 4 ratio (ref 0.0–5.0)
Cholesterol, Total: 129 mg/dL (ref 100–199)
HDL: 32 mg/dL — ABNORMAL LOW (ref >39)
LDL Chol Calc (NIH): 72 mg/dL (ref 0–99)
Triglycerides: 138 mg/dL (ref 0–149)
VLDL Cholesterol Cal: 25 mg/dL (ref 5–40)

## 2019-09-17 LAB — CBC WITH DIFFERENTIAL/PLATELET
Basophils Absolute: 0.1 10*3/uL (ref 0.0–0.2)
Basos: 1 %
EOS (ABSOLUTE): 0.2 10*3/uL (ref 0.0–0.4)
Eos: 3 %
Hematocrit: 37.2 % — ABNORMAL LOW (ref 37.5–51.0)
Hemoglobin: 12.1 g/dL — ABNORMAL LOW (ref 13.0–17.7)
Immature Grans (Abs): 0 10*3/uL (ref 0.0–0.1)
Immature Granulocytes: 1 %
Lymphocytes Absolute: 1.4 10*3/uL (ref 0.7–3.1)
Lymphs: 17 %
MCH: 30.2 pg (ref 26.6–33.0)
MCHC: 32.5 g/dL (ref 31.5–35.7)
MCV: 93 fL (ref 79–97)
Monocytes Absolute: 0.7 10*3/uL (ref 0.1–0.9)
Monocytes: 8 %
Neutrophils Absolute: 5.9 10*3/uL (ref 1.4–7.0)
Neutrophils: 70 %
Platelets: 429 10*3/uL (ref 150–450)
RBC: 4.01 x10E6/uL — ABNORMAL LOW (ref 4.14–5.80)
RDW: 13.1 % (ref 11.6–15.4)
WBC: 8.3 10*3/uL (ref 3.4–10.8)

## 2019-09-17 LAB — BAYER DCA HB A1C WAIVED: HB A1C (BAYER DCA - WAIVED): 6.6 % (ref <7.0)

## 2019-09-17 MED ORDER — ATORVASTATIN CALCIUM 20 MG PO TABS: 20.0000 mg | ORAL_TABLET | Freq: Every day | ORAL | 1 refills | Status: DC

## 2019-09-17 MED ORDER — CLONIDINE HCL 0.1 MG PO TABS
0.1000 mg | ORAL_TABLET | Freq: Two times a day (BID) | ORAL | 5 refills | Status: DC
Start: 2019-09-17 — End: 2020-02-11

## 2019-09-17 MED ORDER — SPIRONOLACTONE 25 MG PO TABS: 25.0000 mg | ORAL_TABLET | Freq: Every day | ORAL | 1 refills | Status: DC

## 2019-09-17 MED ORDER — CARVEDILOL 25 MG PO TABS: 25.0000 mg | ORAL_TABLET | Freq: Two times a day (BID) | ORAL | 1 refills | Status: DC

## 2019-09-17 MED ORDER — METFORMIN HCL ER 500 MG PO TB24: 1000.0000 mg | ORAL_TABLET | Freq: Every day | ORAL | 1 refills | Status: DC

## 2019-09-17 MED FILL — CloNIDine HCL 0.1 MG TAB: 0.1 | 90 days supply | Qty: 180 | Fill #0

## 2019-09-17 NOTE — Progress Notes (Signed)
Subjective:  Patient ID: Jim Sullivan, male    DOB: 08/14/1955  Age: 64 y.o. MRN: [id number]  CC: Medical Management of Chronic Issues   HPI Jim Sullivan presents forFollow-up of diabetes. Patient checks blood sugar at home.  Patient denies symptoms such as polyuria, polydipsia, excessive hunger, nausea No significant hypoglycemic spells noted. Medications reviewed. Pt reports taking them regularly without complication/adverse reaction being reported today.  Checking feet daily.   Follow-up of hypertension. Patient has no history of headache chest pain or shortness of breath or recent cough. Patient also denies symptoms of TIA such as numbness weakness lateralizing. Patient checks  blood pressure at home and hashad any multiple elevated readings recently.  Most of his pressures are running 172/64 or similar over the last few days.  He admits that he has trouble remembering the midday dose of the hydralazine.  He very much like to find a medicine that can be taken once or twice a day.  Patient denies side effects from his medication. States taking it regularly.  History Jim Sullivan has a past medical history of Diabetes mellitus without complication ([place]), Erectile dysfunction, GERD (gastroesophageal reflux disease), Hyperlipidemia, and Hypertension.   He has a past surgical history that includes Hernia repair (Right, 2019); Hernia repair (Left, infancy); and Small intestine surgery (2013).   His family history is not on file.He reports that he quit smoking about 6 years ago. His smoking use included cigarettes. He has a 15.00 pack-year smoking history. He has never used smokeless tobacco. He reports current alcohol use. He reports that he does not use drugs.  Current Outpatient Medications on File Prior to Visit  Medication Sig Dispense Refill  . aspirin EC 81 MG tablet Take 81 mg by mouth every evening.     Marland Kitchen JANUVIA 100 MG tablet TAKE 1 TABLET BY MOUTH ONCE A DAY 30 tablet 0  . terazosin  (HYTRIN) 5 MG capsule Take 1 capsule (5 mg total) by mouth at bedtime. 90 capsule 1  . Vitamin D, Ergocalciferol, (DRISDOL) 1.25 MG (50000 UNIT) CAPS capsule Take 1 capsule (50,000 Units total) by mouth every 7 (seven) days. 12 capsule 1   No current facility-administered medications on file prior to visit.    ROS Review of Systems  Constitutional: Negative.   HENT: Negative.   Eyes: Negative for visual disturbance.  Respiratory: Negative for cough and shortness of breath.   Cardiovascular: Negative for chest pain and leg swelling.  Gastrointestinal: Negative for abdominal pain, diarrhea, nausea and vomiting.  Genitourinary: Negative for difficulty urinating.  Musculoskeletal: Negative for arthralgias and myalgias.  Skin: Positive for rash (Recently had moderately severe skin rash from the beach.  From his description it sounds like it was sun poisoning.  He tried multiple remedies without success eventually time took care of it however.).  Neurological: Negative for headaches.  Psychiatric/Behavioral: Negative for sleep disturbance.    Objective:  BP (!) 183/85   Pulse 65   Temp 97.7 F (36.5 C) (Temporal)   Resp 20   Ht 5' 9"  (1.753 m)   Wt 226 lb 4 oz (102.6 kg)   SpO2 97%   BMI 33.41 kg/m   BP Readings from Last 3 Encounters:  09/17/19 (!) 183/85  07/14/19 (!) 163/80  07/08/19 139/79    Wt Readings from Last 3 Encounters:  09/17/19 226 lb 4 oz (102.6 kg)  07/14/19 225 lb 6.4 oz (102.2 kg)  07/08/19 225 lb 6.4 oz (102.2 kg)     Physical Exam  Vitals reviewed.  Constitutional:      Appearance: He is well-developed.  HENT:     Head: Normocephalic and atraumatic.     Right Ear: Tympanic membrane and external ear normal. No decreased hearing noted.     Left Ear: Tympanic membrane and external ear normal. No decreased hearing noted.     Mouth/Throat:     Pharynx: No oropharyngeal exudate or posterior oropharyngeal erythema.  Eyes:     Pupils: Pupils are equal,  round, and reactive to light.  Cardiovascular:     Rate and Rhythm: Normal rate and regular rhythm.     Heart sounds: No murmur heard.   Pulmonary:     Effort: No respiratory distress.     Breath sounds: Normal breath sounds.  Abdominal:     General: Bowel sounds are normal.     Palpations: Abdomen is soft. There is no mass.     Tenderness: There is no abdominal tenderness.  Musculoskeletal:     Cervical back: Normal range of motion and neck supple.       Assessment & Plan:   Jim Sullivan was seen today for medical management of chronic issues.  Diagnoses and all orders for this visit:  Type 2 diabetes mellitus without complication, without long-term current use of insulin (HCC) -     Microalbumin / creatinine urine ratio -     Bayer DCA Hb A1c Waived -     CBC with Differential/Platelet -     CMP14+EGFR -     Lipid panel -     metFORMIN (GLUCOPHAGE-XR) 500 MG 24 hr tablet; Take 2 tablets (1,000 mg total) by mouth daily with breakfast.  Essential hypertension -     CBC with Differential/Platelet -     CMP14+EGFR -     Lipid panel -     carvedilol (COREG) 25 MG tablet; Take 1 tablet (25 mg total) by mouth 2 (two) times daily with a meal. -     spironolactone (ALDACTONE) 25 MG tablet; Take 1 tablet (25 mg total) by mouth daily.  Hyperlipidemia, unspecified hyperlipidemia type -     CBC with Differential/Platelet -     CMP14+EGFR -     Lipid panel -     atorvastatin (LIPITOR) 20 MG tablet; Take 1 tablet (20 mg total) by mouth daily.  Other orders -     Tdap vaccine greater than or equal to 7yo IM -     cloNIDine (CATAPRES) 0.1 MG tablet; Take 1 tablet (0.1 mg total) by mouth 2 (two) times daily. For Blood Pressure      I have discontinued Jim Sullivan's hydrALAZINE, metroNIDAZOLE, and predniSONE. I am also having him start on cloNIDine. Additionally, I am having him maintain his aspirin EC, terazosin, Vitamin D (Ergocalciferol), Januvia, carvedilol, atorvastatin,  metFORMIN, and spironolactone.  Meds ordered this encounter  Medications  . cloNIDine (CATAPRES) 0.1 MG tablet    Sig: Take 1 tablet (0.1 mg total) by mouth 2 (two) times daily. For Blood Pressure    Dispense:  60 tablet    Refill:  5  . carvedilol (COREG) 25 MG tablet    Sig: Take 1 tablet (25 mg total) by mouth 2 (two) times daily with a meal.    Dispense:  180 tablet    Refill:  1  . atorvastatin (LIPITOR) 20 MG tablet    Sig: Take 1 tablet (20 mg total) by mouth daily.    Dispense:  90 tablet    Refill:  1  . metFORMIN (GLUCOPHAGE-XR) 500 MG 24 hr tablet    Sig: Take 2 tablets (1,000 mg total) by mouth daily with breakfast.    Dispense:  180 tablet    Refill:  1  . spironolactone (ALDACTONE) 25 MG tablet    Sig: Take 1 tablet (25 mg total) by mouth daily.    Dispense:  90 tablet    Refill:  1     Follow-up: Return in about 1 month (around 10/17/2019) for hypertension.  Claretta Fraise, M.D.

## 2019-09-18 MED FILL — SPIRONOLACTONE 25 MG TABS: 25 | 90 days supply | Qty: 90 | Fill #0

## 2019-09-18 MED FILL — CARVEDILOL 25 MG TABLET: 25 | 90 days supply | Qty: 180 | Fill #0

## 2019-09-18 MED FILL — metFORMIN HCL ER 500 MG TB2: 500 | 90 days supply | Qty: 180 | Fill #0

## 2019-09-30 ENCOUNTER — Other Ambulatory Visit: Payer: Self-pay | Admitting: Family Medicine

## 2019-09-30 DIAGNOSIS — E1169 Type 2 diabetes mellitus with other specified complication: Secondary | ICD-10-CM

## 2019-09-30 MED FILL — JANUVIA 100 MG TABLET: 100 | 30 days supply | Qty: 30 | Fill #0

## 2019-10-14 ENCOUNTER — Encounter: Payer: Self-pay | Admitting: Family Medicine

## 2019-10-14 ENCOUNTER — Ambulatory Visit (INDEPENDENT_AMBULATORY_CARE_PROVIDER_SITE_OTHER): Payer: No Typology Code available for payment source | Admitting: Family Medicine

## 2019-10-14 ENCOUNTER — Other Ambulatory Visit: Payer: Self-pay

## 2019-10-14 VITALS — BP 133/73 | HR 79 | Temp 97.6°F | Resp 20 | Ht 69.0 in | Wt 227.0 lb

## 2019-10-14 DIAGNOSIS — I10 Essential (primary) hypertension: Secondary | ICD-10-CM | POA: Diagnosis not present

## 2019-10-14 NOTE — Progress Notes (Signed)
Subjective:  Patient ID: Jim Sullivan, male    DOB: [birth year]  Age: 64 y.o. MRN: [id number]  CC: One month  follow up BP   HPI Jim Sullivan presents for  follow-up of hypertension. Patient has no history of headache chest pain or shortness of breath or recent cough. Patient also denies symptoms of TIA such as focal numbness or weakness. Patient denies side effects from medication. States taking it regularly. He did have some light headedness for the first few days of using the clonidine.    History Jim Sullivan has a past medical history of Diabetes mellitus without complication ([place]), Erectile dysfunction, GERD (gastroesophageal reflux disease), Hyperlipidemia, and Hypertension.   He has a past surgical history that includes Hernia repair (Right, 2019); Hernia repair (Left, infancy); and Small intestine surgery (2013).   His family history is not on file.He reports that he quit smoking about 6 years ago. His smoking use included cigarettes. He has a 15.00 pack-year smoking history. He has never used smokeless tobacco. He reports current alcohol use. He reports that he does not use drugs.  Current Outpatient Medications on File Prior to Visit  Medication Sig Dispense Refill   aspirin EC 81 MG tablet Take 81 mg by mouth every evening.      atorvastatin (LIPITOR) 20 MG tablet Take 1 tablet (20 mg total) by mouth daily. 90 tablet 1   carvedilol (COREG) 25 MG tablet Take 1 tablet (25 mg total) by mouth 2 (two) times daily with a meal. 180 tablet 1   cloNIDine (CATAPRES) 0.1 MG tablet Take 1 tablet (0.1 mg total) by mouth 2 (two) times daily. For Blood Pressure 60 tablet 5   JANUVIA 100 MG tablet TAKE 1 TABLET BY MOUTH ONCE A DAY 30 tablet 0   metFORMIN (GLUCOPHAGE-XR) 500 MG 24 hr tablet Take 2 tablets (1,000 mg total) by mouth daily with breakfast. 180 tablet 1   spironolactone (ALDACTONE) 25 MG tablet Take 1 tablet (25 mg total) by mouth daily. 90 tablet 1   terazosin (HYTRIN) 5 MG  capsule Take 1 capsule (5 mg total) by mouth at bedtime. 90 capsule 1   Vitamin D, Ergocalciferol, (DRISDOL) 1.25 MG (50000 UNIT) CAPS capsule Take 1 capsule (50,000 Units total) by mouth every 7 (seven) days. 12 capsule 1   No current facility-administered medications on file prior to visit.    ROS Review of Systems  Respiratory: Negative for shortness of breath.   Cardiovascular: Negative for chest pain.  Gastrointestinal: Negative for abdominal pain.  Neurological: Positive for light-headedness.    Objective:  BP 133/73    Pulse 79    Temp 97.6 F (36.4 C) (Temporal)    Resp 20    Ht 5\' 9"  (1.753 m)    Wt 227 lb (103 kg)    SpO2 98%    BMI 33.52 kg/m   BP Readings from Last 3 Encounters:  10/14/19 133/73  09/17/19 (!) 183/85  07/14/19 (!) 163/80    Wt Readings from Last 3 Encounters:  10/14/19 227 lb (103 kg)  09/17/19 226 lb 4 oz (102.6 kg)  07/14/19 225 lb 6.4 oz (102.2 kg)     Physical Exam Vitals reviewed.  Constitutional:      Appearance: He is well-developed.  HENT:     Head: Normocephalic and atraumatic.     Right Ear: External ear normal.     Left Ear: External ear normal.     Mouth/Throat:     Pharynx: No oropharyngeal exudate  or posterior oropharyngeal erythema.  Eyes:     Pupils: Pupils are equal, round, and reactive to light.  Cardiovascular:     Rate and Rhythm: Normal rate and regular rhythm.     Heart sounds: No murmur heard.   Pulmonary:     Effort: No respiratory distress.     Breath sounds: Normal breath sounds.  Musculoskeletal:     Cervical back: Normal range of motion and neck supple.  Neurological:     Mental Status: He is alert and oriented to person, place, and time.       Assessment & Plan:   Jim Sullivan was seen today for one month  follow up bp.  Diagnoses and all orders for this visit:  Essential hypertension   Allergies as of 10/14/2019      Reactions   Morphine And Related Itching   Skin crawling      Medication  List       Accurate as of October 14, 2019 10:19 PM. If you have any questions, ask your nurse or doctor.        aspirin EC 81 MG tablet Take 81 mg by mouth every evening.   atorvastatin 20 MG tablet Commonly known as: LIPITOR Take 1 tablet (20 mg total) by mouth daily.   carvedilol 25 MG tablet Commonly known as: COREG Take 1 tablet (25 mg total) by mouth 2 (two) times daily with a meal.   cloNIDine 0.1 MG tablet Commonly known as: CATAPRES Take 1 tablet (0.1 mg total) by mouth 2 (two) times daily. For Blood Pressure   Januvia 100 MG tablet Generic drug: sitaGLIPtin TAKE 1 TABLET BY MOUTH ONCE A DAY   metFORMIN 500 MG 24 hr tablet Commonly known as: GLUCOPHAGE-XR Take 2 tablets (1,000 mg total) by mouth daily with breakfast.   spironolactone 25 MG tablet Commonly known as: ALDACTONE Take 1 tablet (25 mg total) by mouth daily.   terazosin 5 MG capsule Commonly known as: HYTRIN Take 1 capsule (5 mg total) by mouth at bedtime.   Vitamin D (Ergocalciferol) 1.25 MG (50000 UNIT) Caps capsule Commonly known as: DRISDOL Take 1 capsule (50,000 Units total) by mouth every 7 (seven) days.           Follow-up: Return in about 3 months (around 01/13/2020).  Claretta Fraise, M.D.

## 2019-10-24 ENCOUNTER — Other Ambulatory Visit: Payer: Self-pay | Admitting: Family Medicine

## 2019-10-24 DIAGNOSIS — E1169 Type 2 diabetes mellitus with other specified complication: Secondary | ICD-10-CM

## 2019-10-24 MED FILL — JANUVIA 100 MG TABLET: 100 | 30 days supply | Qty: 30 | Fill #0

## 2019-11-21 MED FILL — VIT D2 1.25 MG (50,000 UNIT: 1.25 MG | 84 days supply | Qty: 12 | Fill #1

## 2019-11-21 MED FILL — JANUVIA 100 MG TABLET: 100 | 30 days supply | Qty: 30 | Fill #1

## 2019-11-21 MED FILL — ATORVASTATIN CALCIUM 20 MG: 20 | 90 days supply | Qty: 90 | Fill #0

## 2019-12-16 ENCOUNTER — Other Ambulatory Visit: Payer: Self-pay | Admitting: Family Medicine

## 2019-12-16 DIAGNOSIS — E1169 Type 2 diabetes mellitus with other specified complication: Secondary | ICD-10-CM

## 2019-12-16 MED FILL — CloNIDine HCL 0.1 MG TAB: 0.1 | 90 days supply | Qty: 180 | Fill #1

## 2019-12-16 MED FILL — TERAZOSIN 5 MG CAPSULE: 5 | 90 days supply | Qty: 90 | Fill #1

## 2019-12-17 ENCOUNTER — Other Ambulatory Visit: Payer: Self-pay | Admitting: Family Medicine

## 2019-12-17 MED FILL — JANUVIA 100 MG TABLET: 100 | 30 days supply | Qty: 30 | Fill #0

## 2020-01-02 MED FILL — metFORMIN HCL ER 500 MG TB2: 500 | 90 days supply | Qty: 180 | Fill #1

## 2020-01-02 MED FILL — CARVEDILOL 25 MG TABS: 25 | 90 days supply | Qty: 180 | Fill #1

## 2020-01-02 MED FILL — SPIRONOLACTONE 25 MG TABS: 25 | 90 days supply | Qty: 90 | Fill #1

## 2020-01-05 ENCOUNTER — Telehealth: Payer: Self-pay | Admitting: Family Medicine

## 2020-01-05 ENCOUNTER — Other Ambulatory Visit: Payer: Self-pay | Admitting: Family Medicine

## 2020-01-05 DIAGNOSIS — D485 Neoplasm of uncertain behavior of skin: Secondary | ICD-10-CM

## 2020-01-05 NOTE — Telephone Encounter (Signed)
REFERRAL REQUEST Telephone Note  Have you been seen at our office for this problem? Yes (Advise that they may need an appointment with their PCP before a referral can be done)  Reason for Referral: Patient has an appointment  Referral discussed with patient:  Best contact number of patient for referral team:    Has patient been seen by a specialist for this issue before: Yes Patient provider preference for referral: Dr. Elvera Lennox at [facility]  Patient location preference for referral: [facility]    Patient notified that referrals can take up to a week or longer to process. If they haven't heard anything within a week they should call back and speak with the referral department.

## 2020-01-05 NOTE — Telephone Encounter (Signed)
Referral placed, as requested WS 

## 2020-01-05 NOTE — Telephone Encounter (Signed)
I need pt. Symptoms to complete a referral order. Please check with Jim Sullivan

## 2020-01-05 NOTE — Telephone Encounter (Signed)
Patient aware.

## 2020-01-05 NOTE — Telephone Encounter (Signed)
Follow up from [facility].  Patient states that he went to Dr. Modena Nunnery but now has different insurance and needs new referral.  He is supposed to get the moles checked yearly.

## 2020-01-22 ENCOUNTER — Other Ambulatory Visit (HOSPITAL_COMMUNITY): Payer: Self-pay | Admitting: Orthopedic Surgery

## 2020-01-22 MED FILL — CEPHALEXIN 500 MG CAPSULE: 500 | 8 days supply | Qty: 32 | Fill #0

## 2020-01-22 MED FILL — HYDROCODON-APAP 5-325: 5-325 | 7 days supply | Qty: 40 | Fill #0

## 2020-02-06 ENCOUNTER — Other Ambulatory Visit: Payer: Self-pay | Admitting: Family Medicine

## 2020-02-06 DIAGNOSIS — E1169 Type 2 diabetes mellitus with other specified complication: Secondary | ICD-10-CM

## 2020-02-06 DIAGNOSIS — E559 Vitamin D deficiency, unspecified: Secondary | ICD-10-CM

## 2020-02-06 MED FILL — ATORVASTATIN CALCIUM 20 MG: 20 | 90 days supply | Qty: 90 | Fill #1

## 2020-02-06 MED FILL — JANUVIA 100 MG TABLET: 100 | 30 days supply | Qty: 30 | Fill #0

## 2020-02-12 ENCOUNTER — Encounter: Payer: Self-pay | Admitting: Family Medicine

## 2020-02-12 ENCOUNTER — Ambulatory Visit (INDEPENDENT_AMBULATORY_CARE_PROVIDER_SITE_OTHER): Payer: No Typology Code available for payment source | Admitting: Family Medicine

## 2020-02-12 ENCOUNTER — Other Ambulatory Visit: Payer: Self-pay

## 2020-02-12 DIAGNOSIS — I10 Essential (primary) hypertension: Secondary | ICD-10-CM

## 2020-02-12 DIAGNOSIS — E1169 Type 2 diabetes mellitus with other specified complication: Secondary | ICD-10-CM

## 2020-02-12 DIAGNOSIS — E559 Vitamin D deficiency, unspecified: Secondary | ICD-10-CM | POA: Diagnosis not present

## 2020-02-12 DIAGNOSIS — E785 Hyperlipidemia, unspecified: Secondary | ICD-10-CM

## 2020-02-12 DIAGNOSIS — E119 Type 2 diabetes mellitus without complications: Secondary | ICD-10-CM

## 2020-02-12 LAB — BAYER DCA HB A1C WAIVED: HB A1C (BAYER DCA - WAIVED): 7.2 % — ABNORMAL HIGH (ref <7.0)

## 2020-02-13 LAB — CMP14+EGFR
ALT: 16 IU/L (ref 0–44)
AST: 24 IU/L (ref 0–40)
Albumin/Globulin Ratio: 1.5 (ref 1.2–2.2)
Albumin: 4.3 g/dL (ref 3.8–4.8)
Alkaline Phosphatase: 131 IU/L — ABNORMAL HIGH (ref 44–121)
BUN/Creatinine Ratio: 16 (ref 10–24)
BUN: 26 mg/dL (ref 8–27)
Bilirubin Total: 0.2 mg/dL (ref 0.0–1.2)
CO2: 19 mmol/L — ABNORMAL LOW (ref 20–29)
Calcium: 9.6 mg/dL (ref 8.6–10.2)
Chloride: 103 mmol/L (ref 96–106)
Creatinine, Ser: 1.62 mg/dL — ABNORMAL HIGH (ref 0.76–1.27)
GFR calc Af Amer: 51 mL/min/{1.73_m2} — ABNORMAL LOW (ref >59)
GFR calc non Af Amer: 44 mL/min/{1.73_m2} — ABNORMAL LOW (ref >59)
Globulin, Total: 2.8 g/dL (ref 1.5–4.5)
Glucose: 232 mg/dL — ABNORMAL HIGH (ref 65–99)
Potassium: 5.5 mmol/L — ABNORMAL HIGH (ref 3.5–5.2)
Sodium: 138 mmol/L (ref 134–144)
Total Protein: 7.1 g/dL (ref 6.0–8.5)

## 2020-02-13 LAB — CBC WITH DIFFERENTIAL/PLATELET
Basophils Absolute: 0.1 10*3/uL (ref 0.0–0.2)
Basos: 1 %
EOS (ABSOLUTE): 0.3 10*3/uL (ref 0.0–0.4)
Eos: 4 %
Hematocrit: 35.9 % — ABNORMAL LOW (ref 37.5–51.0)
Hemoglobin: 12.1 g/dL — ABNORMAL LOW (ref 13.0–17.7)
Immature Grans (Abs): 0 10*3/uL (ref 0.0–0.1)
Immature Granulocytes: 0 %
Lymphocytes Absolute: 1.4 10*3/uL (ref 0.7–3.1)
Lymphs: 17 %
MCH: 30.6 pg (ref 26.6–33.0)
MCHC: 33.7 g/dL (ref 31.5–35.7)
MCV: 91 fL (ref 79–97)
Monocytes Absolute: 0.7 10*3/uL (ref 0.1–0.9)
Monocytes: 8 %
Neutrophils Absolute: 5.9 10*3/uL (ref 1.4–7.0)
Neutrophils: 70 %
Platelets: 427 10*3/uL (ref 150–450)
RBC: 3.96 x10E6/uL — ABNORMAL LOW (ref 4.14–5.80)
RDW: 12.3 % (ref 11.6–15.4)
WBC: 8.4 10*3/uL (ref 3.4–10.8)

## 2020-02-13 LAB — LIPID PANEL
Chol/HDL Ratio: 5.1 ratio — ABNORMAL HIGH (ref 0.0–5.0)
Cholesterol, Total: 139 mg/dL (ref 100–199)
HDL: 27 mg/dL — ABNORMAL LOW (ref >39)
LDL Chol Calc (NIH): 55 mg/dL (ref 0–99)
Triglycerides: 373 mg/dL — ABNORMAL HIGH (ref 0–149)
VLDL Cholesterol Cal: 57 mg/dL — ABNORMAL HIGH (ref 5–40)

## 2020-02-13 LAB — VARICELLA ZOSTER ANTIBODY, IGG: Varicella zoster IgG: 1550 index (ref >165)

## 2020-02-15 ENCOUNTER — Other Ambulatory Visit: Payer: Self-pay | Admitting: Family Medicine

## 2020-02-15 ENCOUNTER — Encounter: Payer: Self-pay | Admitting: Family Medicine

## 2020-02-15 MED ORDER — SPIRONOLACTONE 25 MG PO TABS: 25.0000 mg | ORAL_TABLET | Freq: Every day | ORAL | 1 refills | Status: DC

## 2020-02-15 MED ORDER — ATORVASTATIN CALCIUM 20 MG PO TABS: 20.0000 mg | ORAL_TABLET | Freq: Every day | ORAL | 1 refills | Status: DC

## 2020-02-15 MED ORDER — VITAMIN D (ERGOCALCIFEROL) 1.25 MG (50000 UNIT) PO CAPS: 50000.0000 [IU] | ORAL_CAPSULE | ORAL | 1 refills | Status: DC

## 2020-02-15 MED ORDER — CLONIDINE HCL 0.1 MG PO TABS: 0.1000 mg | ORAL_TABLET | Freq: Two times a day (BID) | ORAL | 5 refills | Status: DC

## 2020-02-15 MED ORDER — CARVEDILOL 25 MG PO TABS: 25.0000 mg | ORAL_TABLET | Freq: Two times a day (BID) | ORAL | 1 refills | Status: DC

## 2020-02-15 MED ORDER — METFORMIN HCL ER 500 MG PO TB24: 1000.0000 mg | ORAL_TABLET | Freq: Every day | ORAL | 1 refills | Status: DC

## 2020-02-15 MED ORDER — TERAZOSIN HCL 5 MG PO CAPS: 5.0000 mg | ORAL_CAPSULE | Freq: Every day | ORAL | 1 refills | Status: DC

## 2020-02-15 MED ORDER — SITAGLIPTIN PHOSPHATE 100 MG PO TABS
100.0000 mg | ORAL_TABLET | Freq: Every day | ORAL | 1 refills | Status: DC
Start: 2020-02-15 — End: 2020-02-15

## 2020-02-15 NOTE — Progress Notes (Signed)
Subjective:  Patient ID: Jim Sullivan,  male    DOB: 14-May-1955  Age: 65 y.o.    CC: Medical Management of Chronic Issues   HPI Jim Sullivan presents for  follow-up of hypertension. Patient has no history of headache chest pain or shortness of breath or recent cough. Patient also denies symptoms of TIA such as numbness weakness lateralizing. Patient denies side effects from medication. States taking it regularly.  Patient also  in for follow-up of elevated cholesterol. Doing well without complaints on current medication. Denies side effects  including myalgia and arthralgia and nausea. Also in today for liver function testing. Currently no chest pain, shortness of breath or other cardiovascular related symptoms noted.  Follow-up of diabetes. Patient does check blood sugar at home. Readings run between 100 and 180 Patient denies symptoms such as excessive hunger or urinary frequency, excessive hunger, nausea No significant hypoglycemic spells noted. Medications reviewed. Pt reports taking them regularly. Pt. denies complication/adverse reaction today.   Pt. Planning surgery for painful  dupuytren contraction with hand surgery in February.     History Jim Sullivan has a past medical history of Diabetes mellitus without complication ([place]), Erectile dysfunction, GERD (gastroesophageal reflux disease), Hyperlipidemia, and Hypertension.   He has a past surgical history that includes Hernia repair (Right, 2019); Hernia repair (Left, infancy); and Small intestine surgery (2013).   His family history is not on file.He reports that he quit smoking about 7 years ago. His smoking use included cigarettes. He has a 15.00 pack-year smoking history. He has never used smokeless tobacco. He reports current alcohol use. He reports that he does not use drugs.  Current Outpatient Medications on File Prior to Visit  Medication Sig Dispense Refill  . aspirin EC 81 MG tablet Take 81 mg by mouth every evening.      . cephALEXin (KEFLEX) 500 MG capsule cephalexin 500 mg capsule  TAKE 1 CAPSULE BY MOUTH 4 TIMES DAILY FOR 8 DAYS. BEGIN 2 DAYS BEFORE PROCEDURE AND TAKE UNTIL GONE    . hydrALAZINE (APRESOLINE) 10 MG tablet hydralazine 10 mg tablet    . HYDROcodone-acetaminophen (NORCO/VICODIN) 5-325 MG tablet hydrocodone 5 mg-acetaminophen 325 mg tablet  TAKE 1 TABLET BY MOUTH EVERY 4 TO 6 HOURS AS NEEDED FOR PAIN FOR 5 DAYS     No current facility-administered medications on file prior to visit.    ROS Review of Systems  Constitutional: Negative for fever.  Respiratory: Negative for shortness of breath.   Cardiovascular: Negative for chest pain.  Musculoskeletal: Negative for arthralgias.  Skin: Negative for rash.    Objective:  BP 136/80   Pulse 63   Temp 97.8 F (36.6 C) (Temporal)   Ht 5' 9"  (1.753 m)   Wt 233 lb 9.6 oz (106 kg)   BMI 34.50 kg/m   BP Readings from Last 3 Encounters:  02/12/20 136/80  10/14/19 133/73  09/17/19 (!) 183/85    Wt Readings from Last 3 Encounters:  02/12/20 233 lb 9.6 oz (106 kg)  10/14/19 227 lb (103 kg)  09/17/19 226 lb 4 oz (102.6 kg)     Physical Exam Vitals reviewed.  Constitutional:      Appearance: He is well-developed and well-nourished.  HENT:     Head: Normocephalic and atraumatic.     Right Ear: Tympanic membrane and external ear normal. No decreased hearing noted.     Left Ear: Tympanic membrane and external ear normal. No decreased hearing noted.     Mouth/Throat:  Pharynx: No oropharyngeal exudate or posterior oropharyngeal erythema.  Eyes:     Pupils: Pupils are equal, round, and reactive to light.  Cardiovascular:     Rate and Rhythm: Normal rate and regular rhythm.     Heart sounds: No murmur heard.   Pulmonary:     Effort: No respiratory distress.     Breath sounds: Normal breath sounds.  Abdominal:     General: Bowel sounds are normal.     Palpations: Abdomen is soft. There is no mass.     Tenderness: There is  no abdominal tenderness.  Musculoskeletal:        General: Deformity (bilateral dupuytren contractures at 5th fingers) present.     Cervical back: Normal range of motion and neck supple.     Diabetic Foot Exam - Simple   No data filed       Assessment & Plan:   Jim Sullivan was seen today for medical management of chronic issues.  Diagnoses and all orders for this visit:  Vitamin D deficiency -     CBC with Differential/Platelet -     CMP14+EGFR -     Varicella zoster antibody, IgG -     Bayer DCA Hb A1c Waived -     Vitamin D, Ergocalciferol, (DRISDOL) 1.25 MG (50000 UNIT) CAPS capsule; Take 1 capsule (50,000 Units total) by mouth every 7 (seven) days.  Essential hypertension -     CBC with Differential/Platelet -     CMP14+EGFR -     Varicella zoster antibody, IgG -     Bayer DCA Hb A1c Waived -     terazosin (HYTRIN) 5 MG capsule; Take 1 capsule (5 mg total) by mouth at bedtime. -     spironolactone (ALDACTONE) 25 MG tablet; Take 1 tablet (25 mg total) by mouth daily. -     carvedilol (COREG) 25 MG tablet; Take 1 tablet (25 mg total) by mouth 2 (two) times daily with a meal.  Type 2 diabetes mellitus with other specified complication, without long-term current use of insulin (HCC) -     CBC with Differential/Platelet -     CMP14+EGFR -     Varicella zoster antibody, IgG -     Bayer DCA Hb A1c Waived -     metFORMIN (GLUCOPHAGE-XR) 500 MG 24 hr tablet; Take 2 tablets (1,000 mg total) by mouth daily with breakfast. -     sitaGLIPtin (JANUVIA) 100 MG tablet; Take 1 tablet (100 mg total) by mouth daily. -     Bayer DCA Hb A1c Waived  Hyperlipidemia, unspecified hyperlipidemia type -     CBC with Differential/Platelet -     CMP14+EGFR -     Lipid panel -     Varicella zoster antibody, IgG -     Bayer DCA Hb A1c Waived -     atorvastatin (LIPITOR) 20 MG tablet; Take 1 tablet (20 mg total) by mouth daily.  Other orders -     cloNIDine (CATAPRES) 0.1 MG tablet; Take 1 tablet  (0.1 mg total) by mouth 2 (two) times daily. For Blood Pressure   I have changed Jim Sullivan's Januvia to sitaGLIPtin. I am also having him maintain his aspirin EC, Vitamin D (Ergocalciferol), terazosin, metFORMIN, spironolactone, cloNIDine, carvedilol, atorvastatin, cephALEXin, hydrALAZINE, and HYDROcodone-acetaminophen.  Meds ordered this encounter  Medications  . Vitamin D, Ergocalciferol, (DRISDOL) 1.25 MG (50000 UNIT) CAPS capsule    Sig: Take 1 capsule (50,000 Units total) by mouth every 7 (seven) days.  Dispense:  12 capsule    Refill:  1  . terazosin (HYTRIN) 5 MG capsule    Sig: Take 1 capsule (5 mg total) by mouth at bedtime.    Dispense:  90 capsule    Refill:  1  . metFORMIN (GLUCOPHAGE-XR) 500 MG 24 hr tablet    Sig: Take 2 tablets (1,000 mg total) by mouth daily with breakfast.    Dispense:  180 tablet    Refill:  1  . spironolactone (ALDACTONE) 25 MG tablet    Sig: Take 1 tablet (25 mg total) by mouth daily.    Dispense:  90 tablet    Refill:  1  . sitaGLIPtin (JANUVIA) 100 MG tablet    Sig: Take 1 tablet (100 mg total) by mouth daily.    Dispense:  90 tablet    Refill:  1  . cloNIDine (CATAPRES) 0.1 MG tablet    Sig: Take 1 tablet (0.1 mg total) by mouth 2 (two) times daily. For Blood Pressure    Dispense:  60 tablet    Refill:  5  . carvedilol (COREG) 25 MG tablet    Sig: Take 1 tablet (25 mg total) by mouth 2 (two) times daily with a meal.    Dispense:  180 tablet    Refill:  1  . atorvastatin (LIPITOR) 20 MG tablet    Sig: Take 1 tablet (20 mg total) by mouth daily.    Dispense:  90 tablet    Refill:  1     Follow-up: Return in about 3 months (around 05/12/2020).  Claretta Fraise, M.D.

## 2020-02-16 MED FILL — VIT D2 1.25 MG (50,000 UNIT: 1.25 MG | 84 days supply | Qty: 12 | Fill #0

## 2020-03-04 MED FILL — TERAZOSIN 5 MG CAPSULE: 5 | 90 days supply | Qty: 90 | Fill #0

## 2020-03-04 MED FILL — JANUVIA 100 MG TABLET: 100 | 90 days supply | Qty: 90 | Fill #0

## 2020-03-11 MED FILL — CloNIDine HCL 0.1 MG TAB: 0.1 | 30 days supply | Qty: 60 | Fill #0

## 2020-04-06 ENCOUNTER — Other Ambulatory Visit (HOSPITAL_BASED_OUTPATIENT_CLINIC_OR_DEPARTMENT_OTHER): Payer: Self-pay

## 2020-04-08 MED FILL — CloNIDine HCL 0.1 MG TAB: 0.1 | 30 days supply | Qty: 60 | Fill #1

## 2020-04-08 MED FILL — CARVEDILOL 25 MG TABS: 25 | 90 days supply | Qty: 180 | Fill #0

## 2020-04-08 MED FILL — metFORMIN HCL ER 500 MG TB2: 500 | 90 days supply | Qty: 180 | Fill #0

## 2020-04-08 MED FILL — spIRONOLACTONE 25 MG TABS: 25 | 90 days supply | Qty: 90 | Fill #0

## 2020-05-06 ENCOUNTER — Encounter: Payer: Self-pay | Admitting: Family Medicine

## 2020-05-06 ENCOUNTER — Other Ambulatory Visit: Payer: Self-pay

## 2020-05-06 ENCOUNTER — Ambulatory Visit (INDEPENDENT_AMBULATORY_CARE_PROVIDER_SITE_OTHER): Payer: No Typology Code available for payment source | Admitting: Family Medicine

## 2020-05-06 ENCOUNTER — Other Ambulatory Visit (HOSPITAL_COMMUNITY): Payer: Self-pay

## 2020-05-06 VITALS — BP 135/71 | HR 64 | Temp 98.0°F | Ht 69.0 in | Wt 236.0 lb

## 2020-05-06 DIAGNOSIS — E785 Hyperlipidemia, unspecified: Secondary | ICD-10-CM | POA: Diagnosis not present

## 2020-05-06 DIAGNOSIS — E1169 Type 2 diabetes mellitus with other specified complication: Secondary | ICD-10-CM | POA: Diagnosis not present

## 2020-05-06 DIAGNOSIS — I10 Essential (primary) hypertension: Secondary | ICD-10-CM

## 2020-05-06 DIAGNOSIS — E559 Vitamin D deficiency, unspecified: Secondary | ICD-10-CM

## 2020-05-06 LAB — BAYER DCA HB A1C WAIVED: HB A1C (BAYER DCA - WAIVED): 7.3 % — ABNORMAL HIGH (ref <7.0)

## 2020-05-06 MED ORDER — TERAZOSIN HCL 5 MG PO CAPS
ORAL_CAPSULE | Freq: Every day | ORAL | 1 refills | Status: DC
Start: 2020-05-06 — End: 2020-08-05
  Filled 2020-05-06 – 2020-06-21 (×2): qty 90, 90d supply, fill #0

## 2020-05-06 MED ORDER — ATORVASTATIN CALCIUM 20 MG PO TABS
ORAL_TABLET | Freq: Every day | ORAL | 1 refills | Status: DC
  Filled 2020-05-06: qty 90, 90d supply, fill #0

## 2020-05-06 MED ORDER — VITAMIN D (ERGOCALCIFEROL) 1.25 MG (50000 UNIT) PO CAPS
ORAL_CAPSULE | ORAL | 1 refills | Status: DC
  Filled 2020-05-06: qty 12, 84d supply, fill #0

## 2020-05-06 MED ORDER — SITAGLIPTIN PHOSPHATE 100 MG PO TABS
ORAL_TABLET | Freq: Every day | ORAL | 1 refills | Status: DC
  Filled 2020-05-06: qty 30, 30d supply, fill #0
  Filled 2020-06-21: qty 30, 30d supply, fill #1
  Filled 2020-07-21: qty 30, 30d supply, fill #2

## 2020-05-06 MED ORDER — SPIRONOLACTONE 25 MG PO TABS
ORAL_TABLET | Freq: Every day | ORAL | 1 refills | Status: DC
  Filled 2020-05-06: qty 90, 90d supply, fill #0

## 2020-05-06 MED ORDER — METFORMIN HCL ER 500 MG PO TB24
ORAL_TABLET | Freq: Every day | ORAL | 1 refills | Status: DC
  Filled 2020-05-06 – 2020-06-21 (×2): qty 180, 90d supply, fill #0

## 2020-05-06 MED ORDER — CARVEDILOL 25 MG PO TABS
ORAL_TABLET | ORAL | 1 refills | Status: DC
  Filled 2020-05-06 – 2020-06-21 (×2): qty 180, 90d supply, fill #0

## 2020-05-06 MED ORDER — CLONIDINE HCL 0.1 MG PO TABS
ORAL_TABLET | ORAL | 5 refills | Status: DC
  Filled 2020-05-06: qty 60, 30d supply, fill #0
  Filled 2020-06-21: qty 60, 30d supply, fill #1
  Filled 2020-07-21: qty 60, 30d supply, fill #2

## 2020-05-06 NOTE — Progress Notes (Signed)
Subjective:  Patient ID: Jim Sullivan,  male    DOB: August 05, 1955  Age: 64 y.o.    CC: Medical Management of Chronic Issues   HPI Jim Sullivan presents for  follow-up of hypertension. Patient has no history of headache chest pain or shortness of breath or recent cough. Patient also denies symptoms of TIA such as numbness weakness lateralizing. Patient denies side effects from medication. States taking it regularly.  Patient also  in for follow-up of elevated cholesterol. Doing well without complaints on current medication. Denies side effects  including myalgia and arthralgia and nausea. Also in today for liver function testing. Currently no chest pain, shortness of breath or other cardiovascular related symptoms noted.  Follow-up of diabetes. Patient does check blood sugar at home. Readings run up and down between 135 and 160. Sometimes not eating right.  Patient denies symptoms such as excessive hunger or urinary frequency, excessive hunger, nausea No significant hypoglycemic spells noted. Medications reviewed. Pt reports taking them regularly. Pt. denies complication/adverse reaction today.   Taking melolukin for prostate along with others, including digestive enzymes, fiber snf protein shakes.    History Jim Sullivan has a past medical history of Diabetes mellitus without complication ([place]), Erectile dysfunction, GERD (gastroesophageal reflux disease), Hyperlipidemia, and Hypertension.   He has a past surgical history that includes Hernia repair (Right, 2019); Hernia repair (Left, infancy); Small intestine surgery (2013); Trigger finger release (Left); and hand surgery (Left).   His family history is not on file.He reports that he quit smoking about 7 years ago. His smoking use included cigarettes. He has a 15.00 pack-year smoking history. He has never used smokeless tobacco. He reports current alcohol use. He reports that he does not use drugs.  Current Outpatient Medications on File  Prior to Visit  Medication Sig Dispense Refill  . aspirin EC 81 MG tablet Take 81 mg by mouth every evening.      No current facility-administered medications on file prior to visit.    ROS Review of Systems  Constitutional: Negative for fever.  Respiratory: Negative for shortness of breath.   Cardiovascular: Negative for chest pain.  Musculoskeletal: Negative for arthralgias.  Skin: Negative for rash.    Objective:  BP 135/71   Pulse 64   Temp 98 F (36.7 C)   Ht 5' 9"  (1.753 m)   Wt 236 lb (107 kg)   SpO2 98%   BMI 34.85 kg/m   BP Readings from Last 3 Encounters:  05/06/20 135/71  02/12/20 136/80  10/14/19 133/73    Wt Readings from Last 3 Encounters:  05/06/20 236 lb (107 kg)  02/12/20 233 lb 9.6 oz (106 kg)  10/14/19 227 lb (103 kg)     Physical Exam Vitals reviewed.  Constitutional:      Appearance: He is well-developed.  HENT:     Head: Normocephalic and atraumatic.     Right Ear: Tympanic membrane and external ear normal. No decreased hearing noted.     Left Ear: Tympanic membrane and external ear normal. No decreased hearing noted.     Mouth/Throat:     Pharynx: No oropharyngeal exudate or posterior oropharyngeal erythema.  Eyes:     Pupils: Pupils are equal, round, and reactive to light.  Cardiovascular:     Rate and Rhythm: Normal rate and regular rhythm.     Heart sounds: No murmur heard.   Pulmonary:     Effort: No respiratory distress.     Breath sounds: Normal breath sounds.  Abdominal:  General: Bowel sounds are normal.     Palpations: Abdomen is soft. There is no mass.     Tenderness: There is no abdominal tenderness.  Musculoskeletal:     Cervical back: Normal range of motion and neck supple.     Diabetic Foot Exam - Simple   No data filed       Assessment & Plan:   Jim Sullivan was seen today for medical management of chronic issues.  Diagnoses and all orders for this visit:  Type 2 diabetes mellitus with other specified  complication, without long-term current use of insulin (HCC) -     Bayer DCA Hb A1c Waived -     CBC with Differential/Platelet -     CMP14+EGFR -     metFORMIN (GLUCOPHAGE-XR) 500 MG 24 hr tablet; TAKE 2 TABLETS BY MOUTH DAILY WITH BREAKFAST -     sitaGLIPtin (JANUVIA) 100 MG tablet; TAKE 1 TABLET BY MOUTH DAILY  Essential hypertension -     carvedilol (COREG) 25 MG tablet; TAKE 1 TABLET BY MOUTH 2 TIMES DAILY WITH MEALS -     spironolactone (ALDACTONE) 25 MG tablet; TAKE 1 TABLET BY MOUTH DAILY -     terazosin (HYTRIN) 5 MG capsule; TAKE 1 CAPSULE BY MOUTH AT BEDTIME  Hyperlipidemia, unspecified hyperlipidemia type -     Lipid panel -     atorvastatin (LIPITOR) 20 MG tablet; TAKE 1 TABLET BY MOUTH DAILY  Vitamin D deficiency -     Vitamin D, Ergocalciferol, (DRISDOL) 1.25 MG (50000 UNIT) CAPS capsule; TAKE 1 CAPSULE BY MOUTH EVERY 7 DAYS  Other orders -     cloNIDine (CATAPRES) 0.1 MG tablet; TAKE 1 TABLET BY MOUTH 2 TIMES DAILY FOR BLOOD PRESSURE   I have discontinued Jim Sullivan's cephALEXin, hydrALAZINE, HYDROcodone-acetaminophen, cephALEXin, and HYDROcodone-acetaminophen. I am also having him maintain his aspirin EC, atorvastatin, carvedilol, cloNIDine, metFORMIN, sitaGLIPtin, spironolactone, terazosin, and Vitamin D (Ergocalciferol).  Meds ordered this encounter  Medications  . atorvastatin (LIPITOR) 20 MG tablet    Sig: TAKE 1 TABLET BY MOUTH DAILY    Dispense:  90 tablet    Refill:  1  . carvedilol (COREG) 25 MG tablet    Sig: TAKE 1 TABLET BY MOUTH 2 TIMES DAILY WITH MEALS    Dispense:  180 tablet    Refill:  1  . cloNIDine (CATAPRES) 0.1 MG tablet    Sig: TAKE 1 TABLET BY MOUTH 2 TIMES DAILY FOR BLOOD PRESSURE    Dispense:  60 tablet    Refill:  5  . metFORMIN (GLUCOPHAGE-XR) 500 MG 24 hr tablet    Sig: TAKE 2 TABLETS BY MOUTH DAILY WITH BREAKFAST    Dispense:  180 tablet    Refill:  1  . sitaGLIPtin (JANUVIA) 100 MG tablet    Sig: TAKE 1 TABLET BY MOUTH DAILY     Dispense:  90 tablet    Refill:  1  . spironolactone (ALDACTONE) 25 MG tablet    Sig: TAKE 1 TABLET BY MOUTH DAILY    Dispense:  90 tablet    Refill:  1  . terazosin (HYTRIN) 5 MG capsule    Sig: TAKE 1 CAPSULE BY MOUTH AT BEDTIME    Dispense:  90 capsule    Refill:  1  . Vitamin D, Ergocalciferol, (DRISDOL) 1.25 MG (50000 UNIT) CAPS capsule    Sig: TAKE 1 CAPSULE BY MOUTH EVERY 7 DAYS    Dispense:  12 capsule    Refill:  1  Pt. Declines ozempic after intense discussion of need to lose weight.  Follow-up: Return in about 3 months (around 08/05/2020).  Claretta Fraise, M.D.

## 2020-05-07 ENCOUNTER — Telehealth: Payer: Self-pay | Admitting: *Deleted

## 2020-05-07 LAB — CBC WITH DIFFERENTIAL/PLATELET
Basophils Absolute: 0.1 10*3/uL (ref 0.0–0.2)
Basos: 1 %
EOS (ABSOLUTE): 0.4 10*3/uL (ref 0.0–0.4)
Eos: 4 %
Hematocrit: 35.7 % — ABNORMAL LOW (ref 37.5–51.0)
Hemoglobin: 12.2 g/dL — ABNORMAL LOW (ref 13.0–17.7)
Immature Grans (Abs): 0 10*3/uL (ref 0.0–0.1)
Immature Granulocytes: 0 %
Lymphocytes Absolute: 1.4 10*3/uL (ref 0.7–3.1)
Lymphs: 17 %
MCH: 31 pg (ref 26.6–33.0)
MCHC: 34.2 g/dL (ref 31.5–35.7)
MCV: 91 fL (ref 79–97)
Monocytes Absolute: 0.8 10*3/uL (ref 0.1–0.9)
Monocytes: 9 %
Neutrophils Absolute: 5.5 10*3/uL (ref 1.4–7.0)
Neutrophils: 69 %
Platelets: 408 10*3/uL (ref 150–450)
RBC: 3.94 x10E6/uL — ABNORMAL LOW (ref 4.14–5.80)
RDW: 12.6 % (ref 11.6–15.4)
WBC: 8.1 10*3/uL (ref 3.4–10.8)

## 2020-05-07 LAB — CMP14+EGFR
ALT: 16 IU/L (ref 0–44)
AST: 21 IU/L (ref 0–40)
Albumin/Globulin Ratio: 1.7 (ref 1.2–2.2)
Albumin: 4.5 g/dL (ref 3.8–4.8)
Alkaline Phosphatase: 126 IU/L — ABNORMAL HIGH (ref 44–121)
BUN/Creatinine Ratio: 14 (ref 10–24)
BUN: 25 mg/dL (ref 8–27)
Bilirubin Total: 0.3 mg/dL (ref 0.0–1.2)
CO2: 21 mmol/L (ref 20–29)
Calcium: 10.2 mg/dL (ref 8.6–10.2)
Chloride: 99 mmol/L (ref 96–106)
Creatinine, Ser: 1.74 mg/dL — ABNORMAL HIGH (ref 0.76–1.27)
Globulin, Total: 2.7 g/dL (ref 1.5–4.5)
Glucose: 194 mg/dL — ABNORMAL HIGH (ref 65–99)
Potassium: 6.2 mmol/L (ref 3.5–5.2)
Sodium: 136 mmol/L (ref 134–144)
Total Protein: 7.2 g/dL (ref 6.0–8.5)
eGFR: 43 mL/min/{1.73_m2} — ABNORMAL LOW (ref >59)

## 2020-05-07 LAB — LIPID PANEL
Chol/HDL Ratio: 4.4 ratio (ref 0.0–5.0)
Cholesterol, Total: 136 mg/dL (ref 100–199)
HDL: 31 mg/dL — ABNORMAL LOW (ref >39)
LDL Chol Calc (NIH): 79 mg/dL (ref 0–99)
Triglycerides: 147 mg/dL (ref 0–149)
VLDL Cholesterol Cal: 26 mg/dL (ref 5–40)

## 2020-05-07 NOTE — Telephone Encounter (Signed)
Pt has potassium level of 6.2-per Marjorie Smolder pt is to hold his aldactone x 1 week and then repeat the potassium level. TTC pt but no answer so left message that it was very important to return our call today regarding one of his labs.

## 2020-05-08 ENCOUNTER — Other Ambulatory Visit (HOSPITAL_COMMUNITY): Payer: Self-pay

## 2020-05-10 ENCOUNTER — Other Ambulatory Visit: Payer: Self-pay | Admitting: *Deleted

## 2020-05-10 ENCOUNTER — Other Ambulatory Visit (HOSPITAL_COMMUNITY): Payer: Self-pay

## 2020-05-10 DIAGNOSIS — E875 Hyperkalemia: Secondary | ICD-10-CM

## 2020-05-10 NOTE — Telephone Encounter (Signed)
Patient states he got the message on Friday and went ahead and stopped the medication.  He also spoke with someone here today who explained everything to him.  He is planning on coming in next Monday to have lab work redrawn.

## 2020-05-13 ENCOUNTER — Other Ambulatory Visit (HOSPITAL_COMMUNITY): Payer: Self-pay

## 2020-05-13 MED ORDER — MELOXICAM 15 MG PO TABS
15.0000 mg | ORAL_TABLET | Freq: Every day | ORAL | 2 refills | Status: DC
  Filled 2020-05-13: qty 30, 30d supply, fill #0

## 2020-05-18 ENCOUNTER — Other Ambulatory Visit: Payer: Self-pay

## 2020-05-18 ENCOUNTER — Other Ambulatory Visit: Payer: No Typology Code available for payment source

## 2020-05-18 DIAGNOSIS — E875 Hyperkalemia: Secondary | ICD-10-CM

## 2020-05-19 LAB — BMP8+EGFR
BUN/Creatinine Ratio: 10 (ref 10–24)
BUN: 18 mg/dL (ref 8–27)
CO2: 26 mmol/L (ref 20–29)
Calcium: 9.2 mg/dL (ref 8.6–10.2)
Chloride: 102 mmol/L (ref 96–106)
Creatinine, Ser: 1.8 mg/dL — ABNORMAL HIGH (ref 0.76–1.27)
Glucose: 231 mg/dL — ABNORMAL HIGH (ref 65–99)
Potassium: 4.7 mmol/L (ref 3.5–5.2)
Sodium: 141 mmol/L (ref 134–144)
eGFR: 42 mL/min/{1.73_m2} — ABNORMAL LOW (ref >59)

## 2020-05-21 ENCOUNTER — Telehealth: Payer: Self-pay | Admitting: Family Medicine

## 2020-05-23 ENCOUNTER — Other Ambulatory Visit: Payer: Self-pay | Admitting: Family Medicine

## 2020-05-23 DIAGNOSIS — M21949 Unspecified acquired deformity of hand, unspecified hand: Secondary | ICD-10-CM

## 2020-05-23 NOTE — Telephone Encounter (Signed)
Referral placed, as requested WS 

## 2020-05-23 NOTE — Progress Notes (Signed)
Hello Axl,  Your lab result is normal and/or stable.Some minor variations that are not significant are commonly marked abnormal, but do not represent any medical problem for you.  Best regards, Escarlet Saathoff, M.D.

## 2020-05-24 ENCOUNTER — Encounter: Payer: Self-pay | Admitting: Family Medicine

## 2020-05-24 ENCOUNTER — Other Ambulatory Visit: Payer: Self-pay | Admitting: Family Medicine

## 2020-05-24 DIAGNOSIS — Z1211 Encounter for screening for malignant neoplasm of colon: Secondary | ICD-10-CM

## 2020-05-26 ENCOUNTER — Encounter (INDEPENDENT_AMBULATORY_CARE_PROVIDER_SITE_OTHER): Payer: Self-pay | Admitting: *Deleted

## 2020-06-21 ENCOUNTER — Other Ambulatory Visit (HOSPITAL_COMMUNITY): Payer: Self-pay

## 2020-06-24 ENCOUNTER — Other Ambulatory Visit (HOSPITAL_COMMUNITY): Payer: Self-pay

## 2020-07-21 ENCOUNTER — Other Ambulatory Visit (HOSPITAL_COMMUNITY): Payer: Self-pay

## 2020-08-05 ENCOUNTER — Ambulatory Visit (INDEPENDENT_AMBULATORY_CARE_PROVIDER_SITE_OTHER): Payer: No Typology Code available for payment source | Admitting: Family Medicine

## 2020-08-05 ENCOUNTER — Other Ambulatory Visit (HOSPITAL_COMMUNITY): Payer: Self-pay

## 2020-08-05 ENCOUNTER — Encounter: Payer: Self-pay | Admitting: Family Medicine

## 2020-08-05 ENCOUNTER — Other Ambulatory Visit: Payer: Self-pay

## 2020-08-05 VITALS — BP 183/81 | HR 55 | Temp 97.6°F | Ht 69.0 in | Wt 240.2 lb

## 2020-08-05 DIAGNOSIS — E785 Hyperlipidemia, unspecified: Secondary | ICD-10-CM

## 2020-08-05 DIAGNOSIS — E559 Vitamin D deficiency, unspecified: Secondary | ICD-10-CM | POA: Diagnosis not present

## 2020-08-05 DIAGNOSIS — E1169 Type 2 diabetes mellitus with other specified complication: Secondary | ICD-10-CM | POA: Diagnosis not present

## 2020-08-05 DIAGNOSIS — Z23 Encounter for immunization: Secondary | ICD-10-CM | POA: Diagnosis not present

## 2020-08-05 DIAGNOSIS — I10 Essential (primary) hypertension: Secondary | ICD-10-CM

## 2020-08-05 LAB — BAYER DCA HB A1C WAIVED: HB A1C (BAYER DCA - WAIVED): 7.9 % — ABNORMAL HIGH (ref <7.0)

## 2020-08-05 MED ORDER — METFORMIN HCL ER 500 MG PO TB24
ORAL_TABLET | Freq: Every day | ORAL | 3 refills | Status: DC
  Filled 2020-08-05 – 2020-09-23 (×2): qty 180, 90d supply, fill #0
  Filled 2020-12-03: qty 180, 90d supply, fill #1

## 2020-08-05 MED ORDER — CLONIDINE HCL 0.2 MG PO TABS
0.2000 mg | ORAL_TABLET | Freq: Two times a day (BID) | ORAL | 3 refills | Status: DC
Start: 2020-08-05 — End: 2021-06-21
  Filled 2020-08-05: qty 180, 90d supply, fill #0
  Filled 2020-11-01: qty 180, 90d supply, fill #1
  Filled 2021-01-07: qty 180, 90d supply, fill #2
  Filled 2021-04-13: qty 180, 90d supply, fill #3

## 2020-08-05 MED ORDER — ATORVASTATIN CALCIUM 20 MG PO TABS
ORAL_TABLET | Freq: Every day | ORAL | 1 refills | Status: DC
  Filled 2020-08-05: qty 90, 90d supply, fill #0
  Filled 2020-11-01: qty 90, 90d supply, fill #1

## 2020-08-05 MED ORDER — SPIRONOLACTONE 25 MG PO TABS
ORAL_TABLET | Freq: Every day | ORAL | 3 refills | Status: DC
  Filled 2020-08-05: qty 90, 90d supply, fill #0
  Filled 2020-11-01: qty 90, 90d supply, fill #1
  Filled 2021-01-07: qty 90, 90d supply, fill #2
  Filled 2021-04-20: qty 90, 90d supply, fill #3

## 2020-08-05 MED ORDER — SITAGLIPTIN PHOSPHATE 100 MG PO TABS
ORAL_TABLET | Freq: Every day | ORAL | 3 refills | Status: DC
  Filled 2020-08-05: qty 30, 30d supply, fill #0
  Filled 2020-09-23: qty 30, 30d supply, fill #1
  Filled 2020-11-01: qty 30, 30d supply, fill #2
  Filled 2020-12-03: qty 30, 30d supply, fill #3
  Filled 2021-01-07: qty 30, 30d supply, fill #4
  Filled 2021-02-14: qty 30, 30d supply, fill #5
  Filled 2021-03-15: qty 30, 30d supply, fill #6
  Filled 2021-04-13: qty 30, 30d supply, fill #7
  Filled 2021-05-12: qty 30, 30d supply, fill #8
  Filled 2021-06-17: qty 30, 30d supply, fill #9

## 2020-08-05 MED ORDER — TERAZOSIN HCL 5 MG PO CAPS
ORAL_CAPSULE | Freq: Every day | ORAL | 3 refills | Status: DC
  Filled 2020-08-05 – 2020-09-23 (×2): qty 90, 90d supply, fill #0
  Filled 2020-12-03: qty 90, 90d supply, fill #1
  Filled 2021-03-15: qty 90, 90d supply, fill #2
  Filled 2021-06-17: qty 90, 90d supply, fill #3

## 2020-08-05 MED ORDER — VITAMIN D (ERGOCALCIFEROL) 1.25 MG (50000 UNIT) PO CAPS
ORAL_CAPSULE | ORAL | 3 refills | Status: DC
  Filled 2020-08-05: qty 12, 84d supply, fill #0
  Filled 2020-12-03: qty 12, 84d supply, fill #1
  Filled 2021-02-14: qty 12, 84d supply, fill #2
  Filled 2021-05-12: qty 12, 84d supply, fill #3

## 2020-08-05 MED ORDER — CARVEDILOL 25 MG PO TABS
ORAL_TABLET | ORAL | 3 refills | Status: DC
  Filled 2020-08-05 – 2020-09-23 (×2): qty 180, 90d supply, fill #0
  Filled 2021-01-07: qty 180, 90d supply, fill #1
  Filled 2021-04-13: qty 180, 90d supply, fill #2
  Filled 2021-07-07: qty 180, 90d supply, fill #3

## 2020-08-05 NOTE — Progress Notes (Signed)
Subjective:  Patient ID: Jim Sullivan,  male    DOB: 05-Jul-1955  Age: 65 y.o.    CC: Medical Management of Chronic Issues   HPI [name] presents for  follow-up of hypertension. Patient has no history of headache chest pain or shortness of breath or recent cough. Patient also denies symptoms of TIA such as numbness weakness lateralizing. Patient denies side effects from medication. States taking it regularly. Runs 073-710 systolic at home.   Patient also  in for follow-up of elevated cholesterol. Doing well without complaints on current medication. Denies side effects  including myalgia and arthralgia and nausea. Also in today for liver function testing. Currently no chest pain, shortness of breath or other cardiovascular related symptoms noted.  Follow-up of diabetes. Patient does not check blood sugar at home. Found herbal prep he asks to try instead of metformin due to concern for liver damage.  Patient denies symptoms such as excessive hunger or urinary frequency, excessive hunger, nausea No significant hypoglycemic spells noted. Medications reviewed. Pt reports taking them regularly. Pt. denies complication/adverse reaction today.    History [name] has a past medical history of Diabetes mellitus without complication ([place]), Erectile dysfunction, GERD (gastroesophageal reflux disease), Hyperlipidemia, and Hypertension.   He has a past surgical history that includes Hernia repair (Right, 2019); Hernia repair (Left, infancy); Small intestine surgery (2013); Trigger finger release (Left); and hand surgery (Left).   His family history is not on file.He reports that he quit smoking about 7 years ago. His smoking use included cigarettes. He has a 15.00 pack-year smoking history. He has never used smokeless tobacco. He reports current alcohol use. He reports that he does not use drugs.  Current Outpatient Medications on File Prior to Visit  Medication Sig Dispense Refill   aspirin  EC 81 MG tablet Take 81 mg by mouth every evening.      meloxicam (MOBIC) 15 MG tablet Take 1 tablet (15 mg total) by mouth daily. 30 tablet 2   No current facility-administered medications on file prior to visit.    ROS Review of Systems  Constitutional:  Negative for fever.  Respiratory:  Negative for shortness of breath.   Cardiovascular:  Negative for chest pain.  Musculoskeletal:  Negative for arthralgias.  Skin:  Negative for rash.   Objective:  BP (!) 183/81   Pulse (!) 55   Temp 97.6 F (36.4 C)   Ht _0  (1.753 m)   Wt 240 lb 3.2 oz (109 kg)   SpO2 97%   BMI 35.47 kg/m   BP Readings from Last 3 Encounters:  08/05/20 (!) 183/81  05/06/20 135/71  02/12/20 136/80    Wt Readings from Last 3 Encounters:  08/05/20 240 lb 3.2 oz (109 kg)  05/06/20 236 lb (107 kg)  02/12/20 233 lb 9.6 oz (106 kg)     Physical Exam Vitals reviewed.  Constitutional:      Appearance: He is well-developed.  HENT:     Head: Normocephalic and atraumatic.     Right Ear: External ear normal.     Left Ear: External ear normal.     Mouth/Throat:     Pharynx: No oropharyngeal exudate or posterior oropharyngeal erythema.  Eyes:     Pupils: Pupils are equal, round, and reactive to light.  Cardiovascular:     Rate and Rhythm: Normal rate and regular rhythm.     Heart sounds: No murmur heard. Pulmonary:     Effort: No respiratory distress.  Breath sounds: Normal breath sounds.  Musculoskeletal:     Cervical back: Normal range of motion and neck supple.  Neurological:     Mental Status: He is alert and oriented to person, place, and time.         Assessment & Plan:   Petros was seen today for medical management of chronic issues.  Diagnoses and all orders for this visit:  Type 2 diabetes mellitus with other specified complication, without long-term current use of insulin (HCC) -     Bayer DCA Hb A1c Waived -     CBC with Differential/Platelet -     metFORMIN  (GLUCOPHAGE-XR) 500 MG 24 hr tablet; TAKE 2 TABLETS BY MOUTH DAILY WITH BREAKFAST -     sitaGLIPtin (JANUVIA) 100 MG tablet; TAKE 1 TABLET BY MOUTH DAILY  Essential hypertension -     CMP14+EGFR -     carvedilol (COREG) 25 MG tablet; TAKE 1 TABLET BY MOUTH 2 TIMES DAILY WITH MEALS -     spironolactone (ALDACTONE) 25 MG tablet; TAKE 1 TABLET BY MOUTH DAILY -     terazosin (HYTRIN) 5 MG capsule; TAKE 1 CAPSULE BY MOUTH AT BEDTIME  Hyperlipidemia, unspecified hyperlipidemia type -     Lipid panel -     atorvastatin (LIPITOR) 20 MG tablet; TAKE 1 TABLET BY MOUTH DAILY  Vitamin D deficiency -     VITAMIN D 25 Hydroxy (Vit-D Deficiency, Fractures) -     Vitamin D, Ergocalciferol, (DRISDOL) 1.25 MG (50000 UNIT) CAPS capsule; TAKE 1 CAPSULE BY MOUTH EVERY 7 DAYS  Need for shingles vaccine -     Varicella-zoster vaccine IM (Shingrix)  Other orders -     cloNIDine (CATAPRES) 0.2 MG tablet; Take 1 tablet (0.2 mg total) by mouth 2 (two) times daily.  I have changed Kairos Mainwaring's cloNIDine. I am also having him maintain his aspirin EC, meloxicam, atorvastatin, carvedilol, metFORMIN, sitaGLIPtin, spironolactone, terazosin, and Vitamin D (Ergocalciferol).  Meds ordered this encounter  Medications   atorvastatin (LIPITOR) 20 MG tablet    Sig: TAKE 1 TABLET BY MOUTH DAILY    Dispense:  90 tablet    Refill:  1   carvedilol (COREG) 25 MG tablet    Sig: TAKE 1 TABLET BY MOUTH 2 TIMES DAILY WITH MEALS    Dispense:  180 tablet    Refill:  3   metFORMIN (GLUCOPHAGE-XR) 500 MG 24 hr tablet    Sig: TAKE 2 TABLETS BY MOUTH DAILY WITH BREAKFAST    Dispense:  180 tablet    Refill:  3   sitaGLIPtin (JANUVIA) 100 MG tablet    Sig: TAKE 1 TABLET BY MOUTH DAILY    Dispense:  90 tablet    Refill:  3   spironolactone (ALDACTONE) 25 MG tablet    Sig: TAKE 1 TABLET BY MOUTH DAILY    Dispense:  90 tablet    Refill:  3   terazosin (HYTRIN) 5 MG capsule    Sig: TAKE 1 CAPSULE BY MOUTH AT BEDTIME     Dispense:  90 capsule    Refill:  3   Vitamin D, Ergocalciferol, (DRISDOL) 1.25 MG (50000 UNIT) CAPS capsule    Sig: TAKE 1 CAPSULE BY MOUTH EVERY 7 DAYS    Dispense:  12 capsule    Refill:  3   cloNIDine (CATAPRES) 0.2 MG tablet    Sig: Take 1 tablet (0.2 mg total) by mouth 2 (two) times daily.    Dispense:  180 tablet  Refill:  3  We had a lively discussion regarding the use of the herbal medicine and start of metformin.  I explained to him that we were monitoring liver functions and experience has shown that liver damage from metformin is virtually nonexistent.  He agrees to continue the medicine.  Follow-up: Return in about 3 months (around 11/05/2020).  Claretta Fraise, M.D.

## 2020-08-06 ENCOUNTER — Encounter: Payer: Self-pay | Admitting: Family Medicine

## 2020-08-06 ENCOUNTER — Other Ambulatory Visit (HOSPITAL_COMMUNITY): Payer: Self-pay

## 2020-08-06 LAB — CMP14+EGFR
ALT: 17 IU/L (ref 0–44)
AST: 21 IU/L (ref 0–40)
Albumin/Globulin Ratio: 1.4 (ref 1.2–2.2)
Albumin: 3.9 g/dL (ref 3.8–4.8)
Alkaline Phosphatase: 133 IU/L — ABNORMAL HIGH (ref 44–121)
BUN/Creatinine Ratio: 10 (ref 10–24)
BUN: 15 mg/dL (ref 8–27)
Bilirubin Total: 0.4 mg/dL (ref 0.0–1.2)
CO2: 28 mmol/L (ref 20–29)
Calcium: 9.7 mg/dL (ref 8.6–10.2)
Chloride: 98 mmol/L (ref 96–106)
Creatinine, Ser: 1.52 mg/dL — ABNORMAL HIGH (ref 0.76–1.27)
Globulin, Total: 2.7 g/dL (ref 1.5–4.5)
Glucose: 230 mg/dL — ABNORMAL HIGH (ref 65–99)
Potassium: 4.7 mmol/L (ref 3.5–5.2)
Sodium: 138 mmol/L (ref 134–144)
Total Protein: 6.6 g/dL (ref 6.0–8.5)
eGFR: 51 mL/min/{1.73_m2} — ABNORMAL LOW (ref >59)

## 2020-08-06 LAB — CBC WITH DIFFERENTIAL/PLATELET
Basophils Absolute: 0.1 10*3/uL (ref 0.0–0.2)
Basos: 1 %
EOS (ABSOLUTE): 0.3 10*3/uL (ref 0.0–0.4)
Eos: 4 %
Hematocrit: 37.8 % (ref 37.5–51.0)
Hemoglobin: 12.3 g/dL — ABNORMAL LOW (ref 13.0–17.7)
Immature Grans (Abs): 0 10*3/uL (ref 0.0–0.1)
Immature Granulocytes: 0 %
Lymphocytes Absolute: 1.4 10*3/uL (ref 0.7–3.1)
Lymphs: 19 %
MCH: 30.1 pg (ref 26.6–33.0)
MCHC: 32.5 g/dL (ref 31.5–35.7)
MCV: 93 fL (ref 79–97)
Monocytes Absolute: 0.7 10*3/uL (ref 0.1–0.9)
Monocytes: 9 %
Neutrophils Absolute: 5.1 10*3/uL (ref 1.4–7.0)
Neutrophils: 67 %
Platelets: 340 10*3/uL (ref 150–450)
RBC: 4.08 x10E6/uL — ABNORMAL LOW (ref 4.14–5.80)
RDW: 12.1 % (ref 11.6–15.4)
WBC: 7.6 10*3/uL (ref 3.4–10.8)

## 2020-08-06 LAB — LIPID PANEL
Chol/HDL Ratio: 4.2 ratio (ref 0.0–5.0)
Cholesterol, Total: 122 mg/dL (ref 100–199)
HDL: 29 mg/dL — ABNORMAL LOW (ref >39)
LDL Chol Calc (NIH): 64 mg/dL (ref 0–99)
Triglycerides: 173 mg/dL — ABNORMAL HIGH (ref 0–149)
VLDL Cholesterol Cal: 29 mg/dL (ref 5–40)

## 2020-08-06 LAB — VITAMIN D 25 HYDROXY (VIT D DEFICIENCY, FRACTURES): Vit D, 25-Hydroxy: 59.9 ng/mL (ref 30.0–100.0)

## 2020-08-06 NOTE — Progress Notes (Signed)
Hello Jim Sullivan,  Your lab result is normal and/or stable.Some minor variations that are not significant are commonly marked abnormal, but do not represent any medical problem for you.  Best regards, Wilba Mutz, M.D.

## 2020-08-14 ENCOUNTER — Other Ambulatory Visit (HOSPITAL_COMMUNITY): Payer: Self-pay

## 2020-09-23 ENCOUNTER — Other Ambulatory Visit (HOSPITAL_COMMUNITY): Payer: Self-pay

## 2020-11-01 ENCOUNTER — Other Ambulatory Visit (HOSPITAL_COMMUNITY): Payer: Self-pay

## 2020-12-03 ENCOUNTER — Other Ambulatory Visit (HOSPITAL_COMMUNITY): Payer: Self-pay

## 2020-12-06 ENCOUNTER — Other Ambulatory Visit: Payer: Self-pay

## 2020-12-06 ENCOUNTER — Encounter: Payer: Self-pay | Admitting: Family Medicine

## 2020-12-06 ENCOUNTER — Ambulatory Visit (INDEPENDENT_AMBULATORY_CARE_PROVIDER_SITE_OTHER): Payer: No Typology Code available for payment source | Admitting: Family Medicine

## 2020-12-06 ENCOUNTER — Other Ambulatory Visit (HOSPITAL_COMMUNITY): Payer: Self-pay

## 2020-12-06 VITALS — BP 128/66 | HR 68 | Temp 97.8°F | Ht 69.0 in | Wt 233.8 lb

## 2020-12-06 DIAGNOSIS — I10 Essential (primary) hypertension: Secondary | ICD-10-CM

## 2020-12-06 DIAGNOSIS — Z125 Encounter for screening for malignant neoplasm of prostate: Secondary | ICD-10-CM | POA: Diagnosis not present

## 2020-12-06 DIAGNOSIS — E785 Hyperlipidemia, unspecified: Secondary | ICD-10-CM

## 2020-12-06 DIAGNOSIS — Z23 Encounter for immunization: Secondary | ICD-10-CM | POA: Diagnosis not present

## 2020-12-06 DIAGNOSIS — E1169 Type 2 diabetes mellitus with other specified complication: Secondary | ICD-10-CM | POA: Diagnosis not present

## 2020-12-06 LAB — BAYER DCA HB A1C WAIVED: HB A1C (BAYER DCA - WAIVED): 7.9 % — ABNORMAL HIGH (ref 4.8–5.6)

## 2020-12-06 MED ORDER — METFORMIN HCL ER 750 MG PO TB24
750.0000 mg | ORAL_TABLET | Freq: Two times a day (BID) | ORAL | 3 refills | Status: DC
  Filled 2020-12-06: qty 180, 90d supply, fill #0
  Filled 2021-02-14: qty 180, 90d supply, fill #1
  Filled 2021-07-07: qty 180, 90d supply, fill #2
  Filled 2021-10-18: qty 180, 90d supply, fill #3

## 2020-12-06 MED ORDER — ATORVASTATIN CALCIUM 20 MG PO TABS
ORAL_TABLET | Freq: Every day | ORAL | 2 refills | Status: DC
  Filled 2020-12-06: qty 90, fill #0
  Filled 2021-01-07: qty 90, 90d supply, fill #0
  Filled 2021-04-20: qty 90, 90d supply, fill #1
  Filled 2021-08-10: qty 90, 90d supply, fill #2

## 2020-12-06 NOTE — Progress Notes (Signed)
Subjective:  Patient ID: Jim Sullivan,  male    DOB: 02-Nov-1955  Age: 65 y.o.    CC: Medical Management of Chronic Issues   HPI Jim Sullivan presents for  follow-up of hypertension. Patient has no history of headache chest pain or shortness of breath or recent cough. Patient also denies symptoms of TIA such as numbness weakness lateralizing. Patient denies side effects from medication. States taking it regularly.  Patient also  in for follow-up of elevated cholesterol. Doing well without complaints on current medication. Denies side effects  including myalgia and arthralgia and nausea. Also in today for liver function testing. Currently no chest pain, shortness of breath or other cardiovascular related symptoms noted.  Follow-up of diabetes. Patient does check blood sugar at home. Readings run between 108 and 128 fasting. Rarely checking prandial. Patient denies symptoms such as excessive hunger or urinary frequency, excessive hunger, nausea No significant hypoglycemic spells noted. Medications reviewed. Pt reports taking them regularly. Pt. denies complication/adverse reaction today.    History Jim Sullivan has a past medical history of Diabetes mellitus without complication ([place]), Erectile dysfunction, GERD (gastroesophageal reflux disease), Hyperlipidemia, and Hypertension.   Jim Sullivan has a past surgical history that includes Hernia repair (Right, 2019); Hernia repair (Left, infancy); Small intestine surgery (2013); Trigger finger release (Left); and hand surgery (Left).   His family history is not on file.Jim Sullivan reports that Jim Sullivan quit smoking about 7 years ago. His smoking use included cigarettes. Jim Sullivan has a 15.00 pack-year smoking history. Jim Sullivan has never used smokeless tobacco. Jim Sullivan reports current alcohol use. Jim Sullivan reports that Jim Sullivan does not use drugs.  Current Outpatient Medications on File Prior to Visit  Medication Sig Dispense Refill   aspirin EC 81 MG tablet Take 81 mg by mouth every evening.       carvedilol (COREG) 25 MG tablet TAKE 1 TABLET BY MOUTH 2 TIMES DAILY WITH MEALS 180 tablet 3   cloNIDine (CATAPRES) 0.2 MG tablet Take 1 tablet (0.2 mg total) by mouth 2 (two) times daily. 180 tablet 3   meloxicam (MOBIC) 15 MG tablet Take 1 tablet (15 mg total) by mouth daily. 30 tablet 2   metFORMIN (GLUCOPHAGE-XR) 500 MG 24 hr tablet TAKE 2 TABLETS BY MOUTH DAILY WITH BREAKFAST 180 tablet 3   sitaGLIPtin (JANUVIA) 100 MG tablet TAKE 1 TABLET BY MOUTH DAILY 90 tablet 3   spironolactone (ALDACTONE) 25 MG tablet TAKE 1 TABLET BY MOUTH DAILY 90 tablet 3   terazosin (HYTRIN) 5 MG capsule TAKE 1 CAPSULE BY MOUTH AT BEDTIME 90 capsule 3   Vitamin D, Ergocalciferol, (DRISDOL) 1.25 MG (50000 UNIT) CAPS capsule TAKE 1 CAPSULE BY MOUTH EVERY 7 DAYS 12 capsule 3   No current facility-administered medications on file prior to visit.    ROS Review of Systems  Constitutional:  Negative for fever.  Respiratory:  Negative for shortness of breath.   Cardiovascular:  Negative for chest pain.  Musculoskeletal:  Negative for arthralgias.  Skin:  Negative for rash.   Objective:  BP 128/66   Pulse 68   Temp 97.8 F (36.6 C)   Ht _0  (1.753 m)   Wt 233 lb 12.8 oz (106.1 kg)   SpO2 99%   BMI 34.53 kg/m   BP Readings from Last 3 Encounters:  12/06/20 128/66  08/05/20 (!) 183/81  05/06/20 135/71    Wt Readings from Last 3 Encounters:  12/06/20 233 lb 12.8 oz (106.1 kg)  08/05/20 240 lb 3.2 oz (109 kg)  05/06/20  236 lb (107 kg)     Physical Exam Vitals reviewed.  Constitutional:      Appearance: Jim Sullivan is well-developed.  HENT:     Head: Normocephalic and atraumatic.     Right Ear: External ear normal.     Left Ear: External ear normal.     Mouth/Throat:     Pharynx: No oropharyngeal exudate or posterior oropharyngeal erythema.  Eyes:     Pupils: Pupils are equal, round, and reactive to light.  Cardiovascular:     Rate and Rhythm: Normal rate and regular rhythm.     Heart sounds: No  murmur heard. Pulmonary:     Effort: No respiratory distress.     Breath sounds: Normal breath sounds.  Musculoskeletal:     Cervical back: Normal range of motion and neck supple.  Neurological:     Mental Status: Jim Sullivan is alert and oriented to person, place, and time.    Diabetic Foot Exam - Simple   Simple Foot Form Diabetic Foot exam was performed with the following findings: Yes 12/06/2020 10:45 AM  Visual Inspection See comments: Yes Sensation Testing Intact to touch and monofilament testing bilaterally: Yes Pulse Check Posterior Tibialis and Dorsalis pulse intact bilaterally: Yes Comments Onychomycosis of all toenails       Assessment & Plan:   Jim Sullivan was seen today for medical management of chronic issues.  Diagnoses and all orders for this visit:  Type 2 diabetes mellitus with other specified complication, without long-term current use of insulin ([place]) -     Bayer DCA Hb A1c Waived  Essential hypertension -     CBC with Differential/Platelet -     CMP14+EGFR  Hyperlipidemia, unspecified hyperlipidemia type -     Lipid panel -     atorvastatin (LIPITOR) 20 MG tablet; TAKE 1 TABLET BY MOUTH DAILY  Screening for prostate cancer -     PSA, total and free  Need for shingles vaccine -     Varicella-zoster vaccine IM (Shingrix)  I am having Jim Sullivan maintain his aspirin EC, meloxicam, carvedilol, metFORMIN, sitaGLIPtin, spironolactone, terazosin, Vitamin D (Ergocalciferol), cloNIDine, and atorvastatin.  Meds ordered this encounter  Medications   atorvastatin (LIPITOR) 20 MG tablet    Sig: TAKE 1 TABLET BY MOUTH DAILY    Dispense:  90 tablet    Refill:  2      Follow-up: Return in about 3 months (around 03/08/2021).  Claretta Fraise, M.D.

## 2020-12-06 NOTE — Addendum Note (Signed)
Addended by: Claretta Fraise on: 12/06/2020 11:01 AM   Modules accepted: Orders

## 2020-12-07 LAB — CBC WITH DIFFERENTIAL/PLATELET
Basophils Absolute: 0.1 10*3/uL (ref 0.0–0.2)
Basos: 1 %
EOS (ABSOLUTE): 0.3 10*3/uL (ref 0.0–0.4)
Eos: 4 %
Hematocrit: 34.7 % — ABNORMAL LOW (ref 37.5–51.0)
Hemoglobin: 11.6 g/dL — ABNORMAL LOW (ref 13.0–17.7)
Immature Grans (Abs): 0 10*3/uL (ref 0.0–0.1)
Immature Granulocytes: 0 %
Lymphocytes Absolute: 1.3 10*3/uL (ref 0.7–3.1)
Lymphs: 17 %
MCH: 30.5 pg (ref 26.6–33.0)
MCHC: 33.4 g/dL (ref 31.5–35.7)
MCV: 91 fL (ref 79–97)
Monocytes Absolute: 0.6 10*3/uL (ref 0.1–0.9)
Monocytes: 7 %
Neutrophils Absolute: 5.7 10*3/uL (ref 1.4–7.0)
Neutrophils: 71 %
Platelets: 389 10*3/uL (ref 150–450)
RBC: 3.8 x10E6/uL — ABNORMAL LOW (ref 4.14–5.80)
RDW: 12.9 % (ref 11.6–15.4)
WBC: 8.1 10*3/uL (ref 3.4–10.8)

## 2020-12-07 LAB — CMP14+EGFR
ALT: 13 IU/L (ref 0–44)
AST: 19 IU/L (ref 0–40)
Albumin/Globulin Ratio: 1.6 (ref 1.2–2.2)
Albumin: 4.1 g/dL (ref 3.8–4.8)
Alkaline Phosphatase: 129 IU/L — ABNORMAL HIGH (ref 44–121)
BUN/Creatinine Ratio: 10 (ref 10–24)
BUN: 19 mg/dL (ref 8–27)
Bilirubin Total: 0.4 mg/dL (ref 0.0–1.2)
CO2: 24 mmol/L (ref 20–29)
Calcium: 10 mg/dL (ref 8.6–10.2)
Chloride: 100 mmol/L (ref 96–106)
Creatinine, Ser: 1.95 mg/dL — ABNORMAL HIGH (ref 0.76–1.27)
Globulin, Total: 2.6 g/dL (ref 1.5–4.5)
Glucose: 198 mg/dL — ABNORMAL HIGH (ref 70–99)
Potassium: 5 mmol/L (ref 3.5–5.2)
Sodium: 137 mmol/L (ref 134–144)
Total Protein: 6.7 g/dL (ref 6.0–8.5)
eGFR: 37 mL/min/{1.73_m2} — ABNORMAL LOW (ref >59)

## 2020-12-07 LAB — PSA, TOTAL AND FREE
PSA, Free Pct: 52.2 %
PSA, Free: 0.47 ng/mL
Prostate Specific Ag, Serum: 0.9 ng/mL (ref 0.0–4.0)

## 2020-12-07 LAB — LIPID PANEL
Chol/HDL Ratio: 4.8 ratio (ref 0.0–5.0)
Cholesterol, Total: 135 mg/dL (ref 100–199)
HDL: 28 mg/dL — ABNORMAL LOW (ref >39)
LDL Chol Calc (NIH): 73 mg/dL (ref 0–99)
Triglycerides: 205 mg/dL — ABNORMAL HIGH (ref 0–149)
VLDL Cholesterol Cal: 34 mg/dL (ref 5–40)

## 2020-12-14 ENCOUNTER — Other Ambulatory Visit (HOSPITAL_COMMUNITY): Payer: Self-pay

## 2020-12-14 ENCOUNTER — Other Ambulatory Visit: Payer: Self-pay | Admitting: Family Medicine

## 2020-12-14 LAB — IRON AND TIBC
Iron Saturation: 25 % (ref 15–55)
Iron: 69 ug/dL (ref 38–169)
Total Iron Binding Capacity: 278 ug/dL (ref 250–450)
UIBC: 209 ug/dL (ref 111–343)

## 2020-12-14 LAB — SPECIMEN STATUS REPORT

## 2020-12-14 LAB — FERRITIN: Ferritin: 133 ng/mL (ref 30–400)

## 2020-12-14 MED ORDER — MELOXICAM 7.5 MG PO TABS
7.5000 mg | ORAL_TABLET | Freq: Every day | ORAL | 1 refills | Status: DC
  Filled 2020-12-14: qty 90, 90d supply, fill #0

## 2021-01-07 ENCOUNTER — Other Ambulatory Visit (HOSPITAL_COMMUNITY): Payer: Self-pay

## 2021-01-15 IMAGING — CR DG SMALL BOWEL
12 of 19 series · 12 of 19 positions shown · non-contrast
Comparison: CT 06/23/2019

CLINICAL DATA: History of Meckel's diverticulum and remote surgery.
Concern for stricture.

EXAM:
SMALL BOWEL SERIES
TECHNIQUE: Following ingestion of thin barium, serial small bowel images were
obtained including spot views of the terminal ileum.
FLUOROSCOPY TIME:  Fluoroscopy Time:  1 minutes 36 seconds
Radiation Exposure Index (if provided by the fluoroscopic device):
218 mGy
Number of Acquired Spot Images: 0

[t ap supine (1 of 8)]
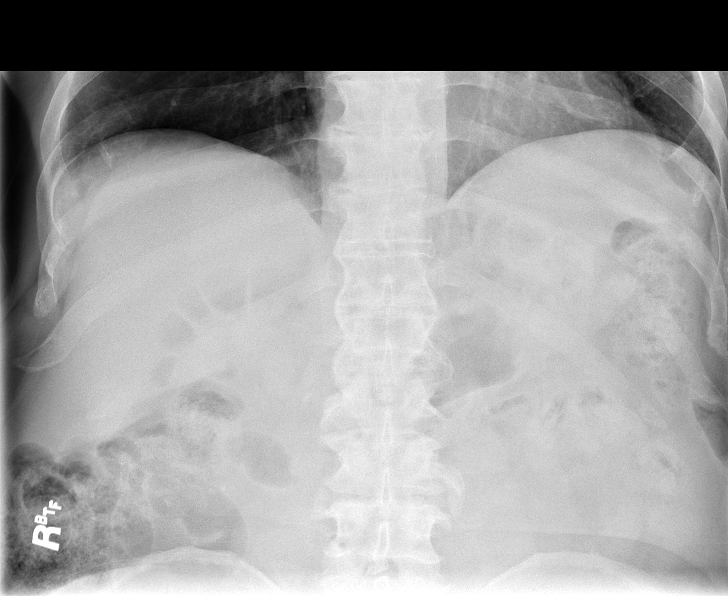

[t ap supine (2 of 8)]
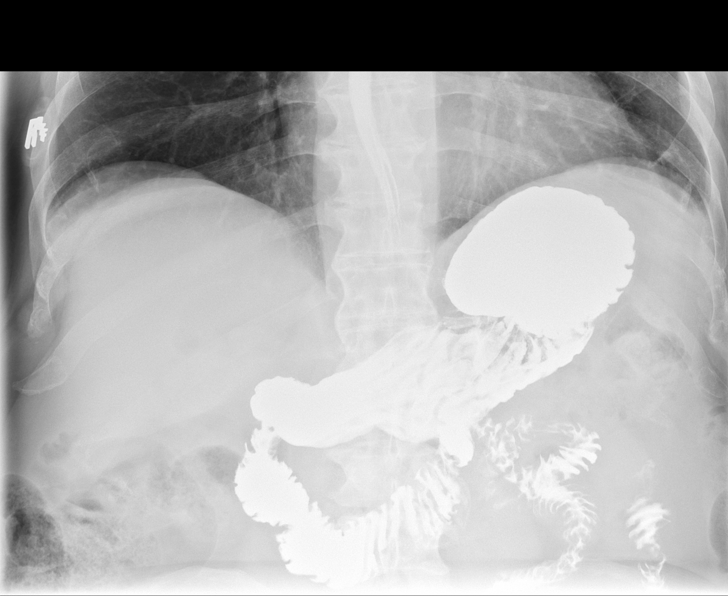

[t ap supine (3 of 8)]
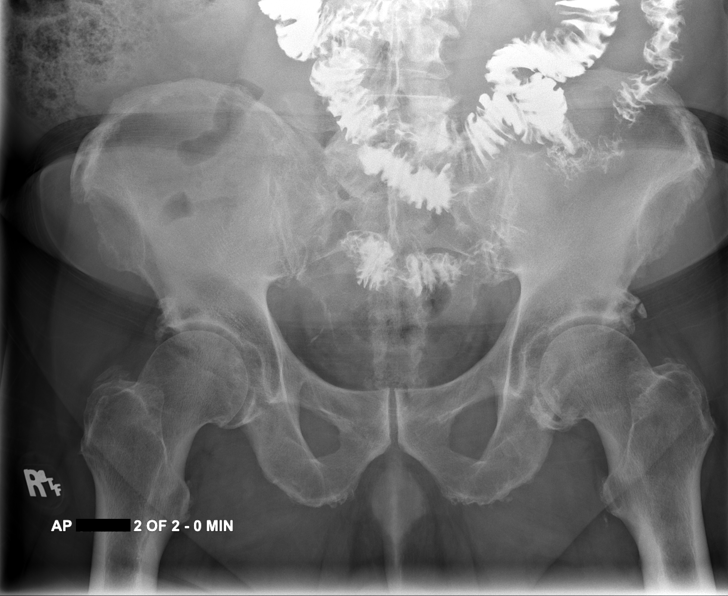

[t ap supine (4 of 8)]
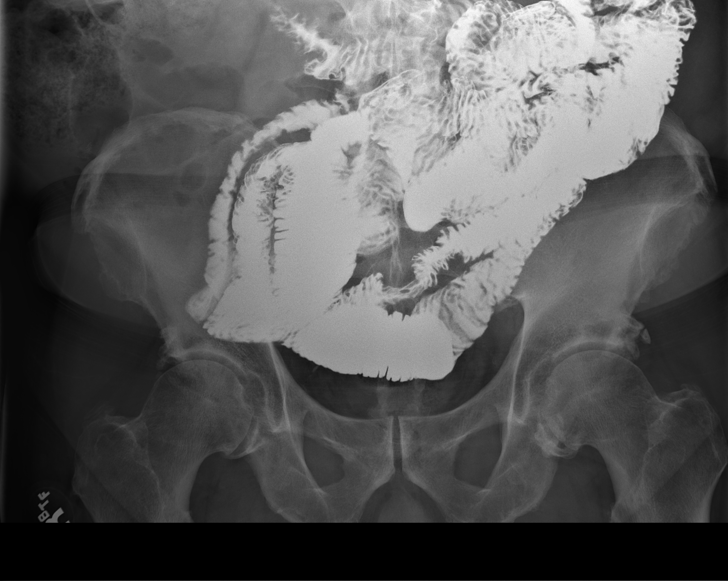

[t ap supine (5 of 8)]
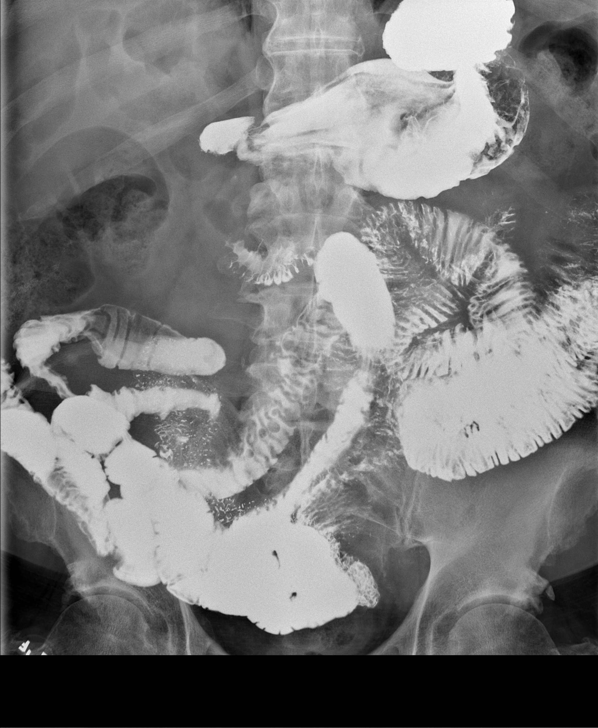

[t ap supine (6 of 8)]
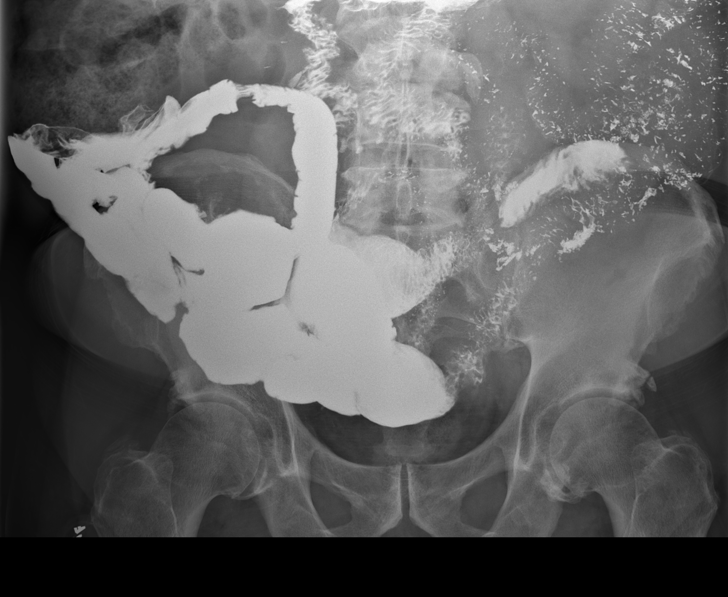

[t ap supine (7 of 8)]
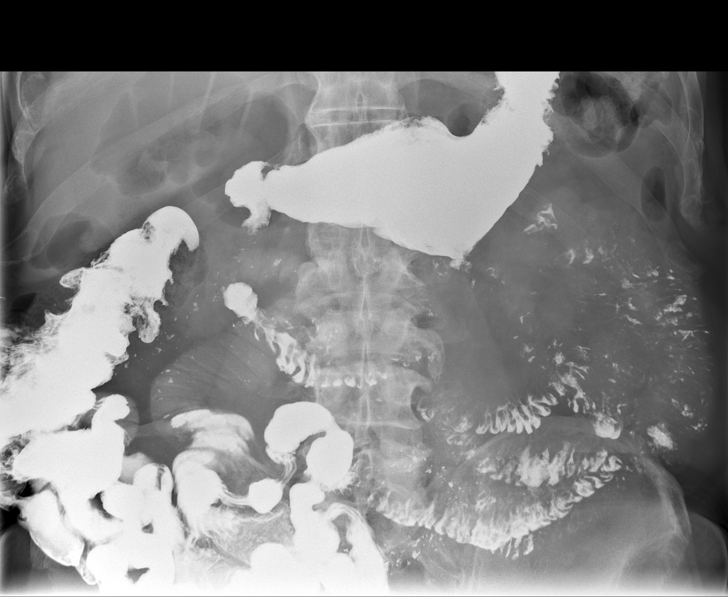

[t ap supine (8 of 8)]
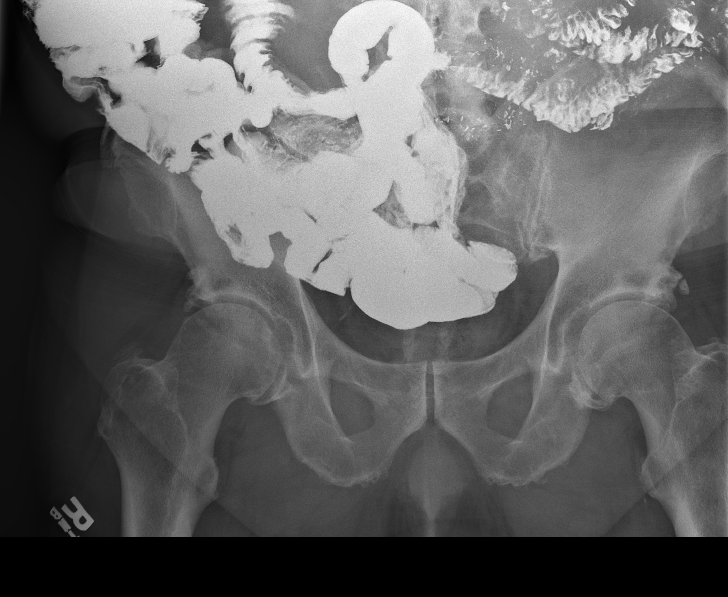

[cp_standard (1 of 4)]
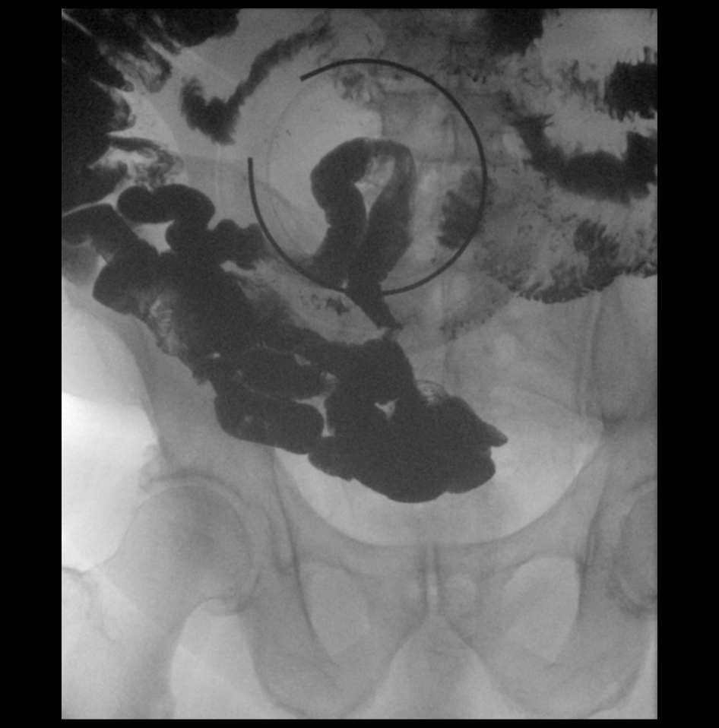

[cp_standard (2 of 4)]
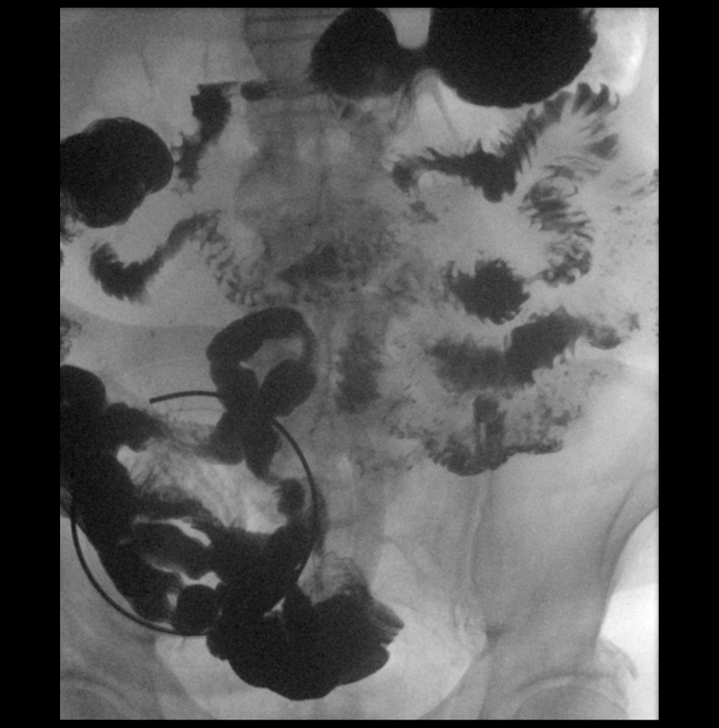

[cp_standard (3 of 4)]
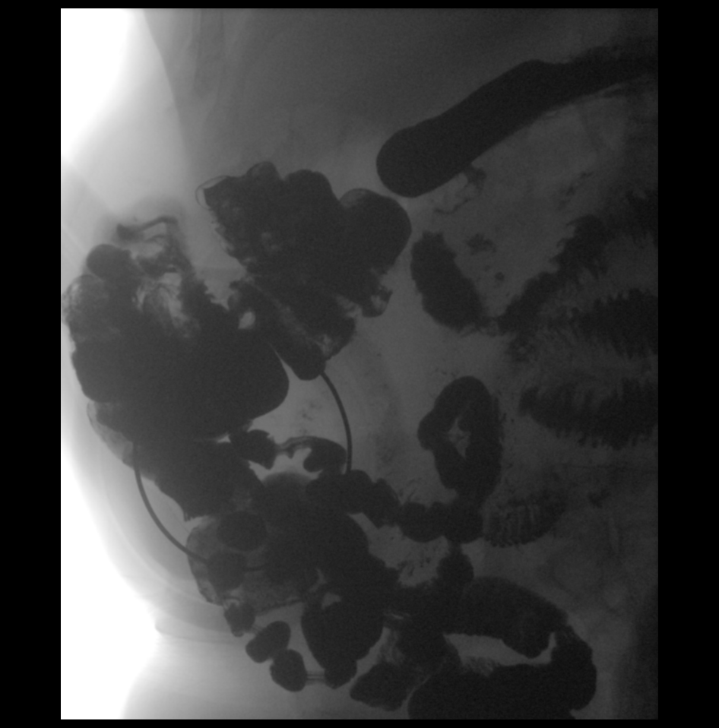

[cp_standard (4 of 4)]
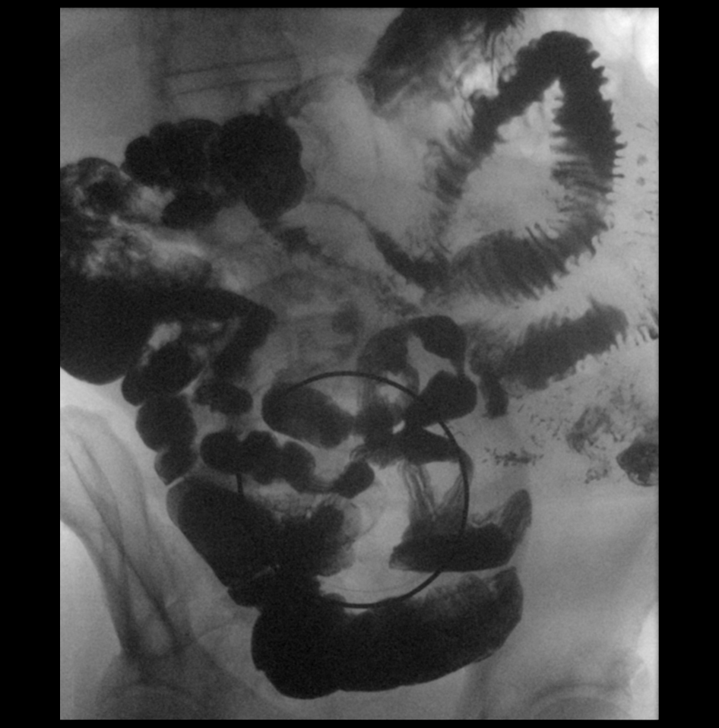

[12 of 19 positions shown; findings below may reference images not displayed]

FINDINGS: Scout image demonstrates nonobstructive bowel gas pattern. No
organomegaly or free air.

Transit time to the colon approximately 3 hours 30 minutes. No
dilated loops of small bowel. No visible stricture or other focal
small bowel abnormality. Terminal ileum unremarkable.
IMPRESSION: No small bowel caliber change or evidence of stricture or focal
abnormality.

## 2021-01-17 ENCOUNTER — Other Ambulatory Visit (HOSPITAL_COMMUNITY): Payer: Self-pay

## 2021-02-14 ENCOUNTER — Other Ambulatory Visit (HOSPITAL_COMMUNITY): Payer: Self-pay

## 2021-02-18 ENCOUNTER — Other Ambulatory Visit (HOSPITAL_COMMUNITY): Payer: Self-pay

## 2021-03-07 ENCOUNTER — Ambulatory Visit: Payer: No Typology Code available for payment source | Admitting: Family Medicine

## 2021-03-15 ENCOUNTER — Other Ambulatory Visit (HOSPITAL_COMMUNITY): Payer: Self-pay

## 2021-04-13 ENCOUNTER — Other Ambulatory Visit (HOSPITAL_COMMUNITY): Payer: Self-pay

## 2021-04-20 ENCOUNTER — Other Ambulatory Visit (HOSPITAL_COMMUNITY): Payer: Self-pay

## 2021-04-21 ENCOUNTER — Other Ambulatory Visit (HOSPITAL_COMMUNITY): Payer: Self-pay

## 2021-05-10 ENCOUNTER — Ambulatory Visit: Payer: No Typology Code available for payment source | Admitting: Family Medicine

## 2021-05-12 ENCOUNTER — Other Ambulatory Visit (HOSPITAL_COMMUNITY): Payer: Self-pay

## 2021-06-02 ENCOUNTER — Other Ambulatory Visit (HOSPITAL_COMMUNITY): Payer: Self-pay

## 2021-06-03 ENCOUNTER — Other Ambulatory Visit (HOSPITAL_COMMUNITY): Payer: Self-pay

## 2021-06-07 ENCOUNTER — Ambulatory Visit: Payer: No Typology Code available for payment source | Admitting: Family Medicine

## 2021-06-17 ENCOUNTER — Other Ambulatory Visit (HOSPITAL_COMMUNITY): Payer: Self-pay

## 2021-06-21 ENCOUNTER — Other Ambulatory Visit (HOSPITAL_COMMUNITY): Payer: Self-pay

## 2021-06-21 ENCOUNTER — Ambulatory Visit (INDEPENDENT_AMBULATORY_CARE_PROVIDER_SITE_OTHER): Payer: No Typology Code available for payment source | Admitting: Family Medicine

## 2021-06-21 ENCOUNTER — Encounter: Payer: Self-pay | Admitting: Family Medicine

## 2021-06-21 VITALS — BP 161/79 | HR 67 | Temp 98.0°F | Ht 69.0 in | Wt 227.8 lb

## 2021-06-21 DIAGNOSIS — E1169 Type 2 diabetes mellitus with other specified complication: Secondary | ICD-10-CM

## 2021-06-21 DIAGNOSIS — I10 Essential (primary) hypertension: Secondary | ICD-10-CM

## 2021-06-21 DIAGNOSIS — E559 Vitamin D deficiency, unspecified: Secondary | ICD-10-CM | POA: Diagnosis not present

## 2021-06-21 DIAGNOSIS — E785 Hyperlipidemia, unspecified: Secondary | ICD-10-CM

## 2021-06-21 LAB — BAYER DCA HB A1C WAIVED: HB A1C (BAYER DCA - WAIVED): 6.8 % — ABNORMAL HIGH (ref 4.8–5.6)

## 2021-06-21 MED ORDER — TERAZOSIN HCL 5 MG PO CAPS
ORAL_CAPSULE | Freq: Every day | ORAL | 3 refills | Status: DC
  Filled 2021-06-21 – 2021-09-13 (×2): qty 90, 90d supply, fill #0
  Filled 2021-12-15: qty 90, 90d supply, fill #1
  Filled 2022-03-14: qty 90, 90d supply, fill #2
  Filled 2022-05-29: qty 90, 90d supply, fill #3

## 2021-06-21 MED ORDER — VITAMIN D (ERGOCALCIFEROL) 1.25 MG (50000 UNIT) PO CAPS
ORAL_CAPSULE | ORAL | 3 refills | Status: DC
  Filled 2021-06-21 – 2021-08-10 (×2): qty 12, 84d supply, fill #0
  Filled 2021-11-09: qty 12, 84d supply, fill #1
  Filled 2022-01-24: qty 12, 84d supply, fill #2
  Filled 2022-05-17: qty 12, 84d supply, fill #3

## 2021-06-21 MED ORDER — SITAGLIPTIN PHOSPHATE 100 MG PO TABS
ORAL_TABLET | Freq: Every day | ORAL | 3 refills | Status: DC
  Filled 2021-06-21 – 2021-07-07 (×2): qty 90, 90d supply, fill #0
  Filled 2021-07-11: qty 30, 30d supply, fill #0
  Filled 2021-08-10 (×2): qty 30, 30d supply, fill #1
  Filled 2021-09-13: qty 30, 30d supply, fill #2
  Filled 2021-10-18: qty 30, 30d supply, fill #3
  Filled 2021-11-17: qty 30, 30d supply, fill #4
  Filled 2021-12-15: qty 30, 30d supply, fill #5
  Filled 2022-01-24: qty 30, 30d supply, fill #6
  Filled 2022-02-21: qty 30, 30d supply, fill #7
  Filled 2022-03-14 – 2022-03-17 (×2): qty 30, 30d supply, fill #8
  Filled 2022-04-18: qty 30, 30d supply, fill #9
  Filled 2022-05-17: qty 30, 30d supply, fill #10
  Filled 2022-06-15: qty 30, 30d supply, fill #11

## 2021-06-21 MED ORDER — CLONIDINE HCL 0.2 MG PO TABS
0.2000 mg | ORAL_TABLET | Freq: Two times a day (BID) | ORAL | 3 refills | Status: DC
  Filled 2021-06-21 – 2021-07-07 (×2): qty 180, 90d supply, fill #0
  Filled 2021-10-18: qty 180, 90d supply, fill #1
  Filled 2021-12-15 – 2022-01-24 (×2): qty 180, 90d supply, fill #2
  Filled 2022-05-17: qty 180, 90d supply, fill #3

## 2021-06-21 MED ORDER — SPIRONOLACTONE 25 MG PO TABS
ORAL_TABLET | Freq: Every day | ORAL | 3 refills | Status: DC
  Filled 2021-06-21 – 2021-07-11 (×3): qty 90, 90d supply, fill #0
  Filled 2021-10-18: qty 90, 90d supply, fill #1
  Filled 2022-01-24: qty 90, 90d supply, fill #2

## 2021-06-21 MED ORDER — QUETIAPINE FUMARATE 25 MG PO TABS
25.0000 mg | ORAL_TABLET | Freq: Every day | ORAL | 1 refills | Status: DC
  Filled 2021-06-21: qty 90, 90d supply, fill #0

## 2021-06-21 NOTE — Progress Notes (Signed)
Subjective:  Patient ID: Jim Sullivan,  male    DOB: 1955-04-02  Age: 66 y.o.    CC: Medical Management of Chronic Issues   HPI Rimas Gilham presents for  follow-up of hypertension. Patient has no history of headache chest pain or shortness of breath or recent cough. Patient also denies symptoms of TIA such as numbness weakness lateralizing. Patient denies side effects from medication. States taking it regularly.  Anxious due wife's illness. Ssvere pain and cramping has been intractable for 6 weeks. Pt. Not sleeping well as a result. This has also kept him from checking his glucose or his BP aat home He missed his clonidine this AM.   Patient also  in for follow-up of elevated cholesterol. Doing well without complaints on current medication. Denies side effects  including myalgia and arthralgia and nausea. Also in today for liver function testing. Currently no chest pain, shortness of breath or other cardiovascular related symptoms noted.  Follow-up of diabetes. Patient does check blood sugar at home, but not recently due to wife's illness.  Patient denies symptoms such as excessive hunger or urinary frequency, excessive hunger, nausea No significant hypoglycemic spells noted. Medications reviewed. Pt reports taking them regularly. Pt. denies complication/adverse reaction today.    History Tatsuya has a past medical history of Diabetes mellitus without complication ([place]), Erectile dysfunction, GERD (gastroesophageal reflux disease), Hyperlipidemia, and Hypertension.   He has a past surgical history that includes Hernia repair (Right, 2019); Hernia repair (Left, infancy); Small intestine surgery (2013); Trigger finger release (Left); and hand surgery (Left).   His family history is not on file.He reports that he quit smoking about 8 years ago. His smoking use included cigarettes. He has a 15.00 pack-year smoking history. He has never used smokeless tobacco. He reports current alcohol  use. He reports that he does not use drugs.  Current Outpatient Medications on File Prior to Visit  Medication Sig Dispense Refill   aspirin EC 81 MG tablet Take 81 mg by mouth every evening.      atorvastatin (LIPITOR) 20 MG tablet TAKE 1 TABLET BY MOUTH DAILY 90 tablet 2   carvedilol (COREG) 25 MG tablet TAKE 1 TABLET BY MOUTH 2 TIMES DAILY WITH MEALS 180 tablet 3   metFORMIN (GLUCOPHAGE-XR) 750 MG 24 hr tablet Take 1 tablet (750 mg total) by mouth 2 (two) times daily. 180 tablet 3   No current facility-administered medications on file prior to visit.    ROS Review of Systems  Constitutional:  Negative for fever.  Respiratory:  Negative for shortness of breath.   Cardiovascular:  Negative for chest pain.  Musculoskeletal:  Negative for arthralgias.  Skin:  Negative for rash.  Psychiatric/Behavioral:  Positive for agitation and dysphoric mood. The patient is nervous/anxious.    Objective:  BP (!) 161/79   Pulse 67   Temp 98 F (36.7 C)   Ht _0  (1.753 m)   Wt 227 lb 12.8 oz (103.3 kg)   SpO2 98%   BMI 33.64 kg/m   BP Readings from Last 3 Encounters:  06/21/21 (!) 161/79  12/06/20 128/66  08/05/20 (!) 183/81    Wt Readings from Last 3 Encounters:  06/21/21 227 lb 12.8 oz (103.3 kg)  12/06/20 233 lb 12.8 oz (106.1 kg)  08/05/20 240 lb 3.2 oz (109 kg)     Physical Exam Constitutional:      General: He is not in acute distress.    Appearance: He is well-developed.  HENT:  Head: Normocephalic and atraumatic.     Right Ear: External ear normal.     Left Ear: External ear normal.     Nose: Nose normal.  Eyes:     Conjunctiva/sclera: Conjunctivae normal.     Pupils: Pupils are equal, round, and reactive to light.  Cardiovascular:     Rate and Rhythm: Normal rate and regular rhythm.     Heart sounds: Normal heart sounds. No murmur heard. Pulmonary:     Effort: Pulmonary effort is normal. No respiratory distress.     Breath sounds: Normal breath sounds. No  wheezing or rales.  Abdominal:     Palpations: Abdomen is soft.     Tenderness: There is no abdominal tenderness.  Musculoskeletal:        General: Normal range of motion.     Cervical back: Normal range of motion and neck supple.  Skin:    General: Skin is warm and dry.  Neurological:     Mental Status: He is alert and oriented to person, place, and time.     Deep Tendon Reflexes: Reflexes are normal and symmetric.  Psychiatric:        Behavior: Behavior normal.        Thought Content: Thought content normal.        Judgment: Judgment normal.    Diabetic Foot Exam - Simple   No data filed     Lab Results  Component Value Date   HGBA1C 6.8 (H) 06/21/2021   HGBA1C 7.9 (H) 12/06/2020   HGBA1C 7.9 (H) 08/05/2020    Assessment & Plan:   Jeray was seen today for medical management of chronic issues.  Diagnoses and all orders for this visit:  Type 2 diabetes mellitus with other specified complication, without long-term current use of insulin (HCC) -     Bayer DCA Hb A1c Waived -     sitaGLIPtin (JANUVIA) 100 MG tablet; TAKE 1 TABLET BY MOUTH DAILY  Essential hypertension -     CBC with Differential/Platelet -     CMP14+EGFR -     spironolactone (ALDACTONE) 25 MG tablet; TAKE 1 TABLET BY MOUTH DAILY -     terazosin (HYTRIN) 5 MG capsule; TAKE 1 CAPSULE BY MOUTH AT BEDTIME  Hyperlipidemia, unspecified hyperlipidemia type -     Lipid panel  Vitamin D deficiency -     Vitamin D, Ergocalciferol, (DRISDOL) 1.25 MG (50000 UNIT) CAPS capsule; TAKE 1 CAPSULE BY MOUTH EVERY 7 DAYS  Other orders -     cloNIDine (CATAPRES) 0.2 MG tablet; Take 1 tablet (0.2 mg total) by mouth 2 (two) times daily. -     QUEtiapine (SEROQUEL) 25 MG tablet; Take 1 tablet (25 mg total) by mouth at bedtime.   I am having [name] start on QUEtiapine. I am also having him maintain his aspirin EC, carvedilol, atorvastatin, metFORMIN, cloNIDine, sitaGLIPtin, spironolactone, terazosin, and Vitamin D  (Ergocalciferol).  Meds ordered this encounter  Medications   cloNIDine (CATAPRES) 0.2 MG tablet    Sig: Take 1 tablet (0.2 mg total) by mouth 2 (two) times daily.    Dispense:  180 tablet    Refill:  3   sitaGLIPtin (JANUVIA) 100 MG tablet    Sig: TAKE 1 TABLET BY MOUTH DAILY    Dispense:  90 tablet    Refill:  3   spironolactone (ALDACTONE) 25 MG tablet    Sig: TAKE 1 TABLET BY MOUTH DAILY    Dispense:  90 tablet  Refill:  3   terazosin (HYTRIN) 5 MG capsule    Sig: TAKE 1 CAPSULE BY MOUTH AT BEDTIME    Dispense:  90 capsule    Refill:  3   Vitamin D, Ergocalciferol, (DRISDOL) 1.25 MG (50000 UNIT) CAPS capsule    Sig: TAKE 1 CAPSULE BY MOUTH EVERY 7 DAYS    Dispense:  12 capsule    Refill:  3   QUEtiapine (SEROQUEL) 25 MG tablet    Sig: Take 1 tablet (25 mg total) by mouth at bedtime.    Dispense:  90 tablet    Refill:  1   We reviewed the importance of taking the clonidine on time due to the potential for reflex hypertension.  He is also going to start checking his blood pressure at home regularly and regularly since it was high in the office today and let me know if it goes over 130/80.  We reviewed checking his blood sugar twice daily as well.  Follow-up: Return in about 3 months (around 09/21/2021), or if symptoms worsen or fail to improve.  Claretta Fraise, M.D.

## 2021-06-22 LAB — CMP14+EGFR
ALT: 15 IU/L (ref 0–44)
AST: 19 IU/L (ref 0–40)
Albumin/Globulin Ratio: 1.4 (ref 1.2–2.2)
Albumin: 4.1 g/dL (ref 3.8–4.8)
Alkaline Phosphatase: 109 IU/L (ref 44–121)
BUN/Creatinine Ratio: 10 (ref 10–24)
BUN: 20 mg/dL (ref 8–27)
Bilirubin Total: 0.3 mg/dL (ref 0.0–1.2)
CO2: 25 mmol/L (ref 20–29)
Calcium: 10.2 mg/dL (ref 8.6–10.2)
Chloride: 98 mmol/L (ref 96–106)
Creatinine, Ser: 1.96 mg/dL — ABNORMAL HIGH (ref 0.76–1.27)
Globulin, Total: 2.9 g/dL (ref 1.5–4.5)
Glucose: 156 mg/dL — ABNORMAL HIGH (ref 70–99)
Potassium: 5.1 mmol/L (ref 3.5–5.2)
Sodium: 136 mmol/L (ref 134–144)
Total Protein: 7 g/dL (ref 6.0–8.5)
eGFR: 37 mL/min/{1.73_m2} — ABNORMAL LOW (ref >59)

## 2021-06-22 LAB — CBC WITH DIFFERENTIAL/PLATELET
Basophils Absolute: 0.1 10*3/uL (ref 0.0–0.2)
Basos: 1 %
EOS (ABSOLUTE): 0.3 10*3/uL (ref 0.0–0.4)
Eos: 4 %
Hematocrit: 33 % — ABNORMAL LOW (ref 37.5–51.0)
Hemoglobin: 11.2 g/dL — ABNORMAL LOW (ref 13.0–17.7)
Immature Grans (Abs): 0 10*3/uL (ref 0.0–0.1)
Immature Granulocytes: 0 %
Lymphocytes Absolute: 1.7 10*3/uL (ref 0.7–3.1)
Lymphs: 22 %
MCH: 30.9 pg (ref 26.6–33.0)
MCHC: 33.9 g/dL (ref 31.5–35.7)
MCV: 91 fL (ref 79–97)
Monocytes Absolute: 0.6 10*3/uL (ref 0.1–0.9)
Monocytes: 8 %
Neutrophils Absolute: 5.2 10*3/uL (ref 1.4–7.0)
Neutrophils: 65 %
Platelets: 414 10*3/uL (ref 150–450)
RBC: 3.63 x10E6/uL — ABNORMAL LOW (ref 4.14–5.80)
RDW: 12.1 % (ref 11.6–15.4)
WBC: 7.9 10*3/uL (ref 3.4–10.8)

## 2021-06-22 LAB — LIPID PANEL
Chol/HDL Ratio: 5.2 ratio — ABNORMAL HIGH (ref 0.0–5.0)
Cholesterol, Total: 140 mg/dL (ref 100–199)
HDL: 27 mg/dL — ABNORMAL LOW (ref >39)
LDL Chol Calc (NIH): 62 mg/dL (ref 0–99)
Triglycerides: 322 mg/dL — ABNORMAL HIGH (ref 0–149)
VLDL Cholesterol Cal: 51 mg/dL — ABNORMAL HIGH (ref 5–40)

## 2021-06-29 ENCOUNTER — Encounter: Payer: Self-pay | Admitting: *Deleted

## 2021-07-07 ENCOUNTER — Other Ambulatory Visit (HOSPITAL_COMMUNITY): Payer: Self-pay

## 2021-07-11 ENCOUNTER — Other Ambulatory Visit (HOSPITAL_COMMUNITY): Payer: Self-pay

## 2021-08-10 ENCOUNTER — Other Ambulatory Visit (HOSPITAL_COMMUNITY): Payer: Self-pay

## 2021-09-06 ENCOUNTER — Ambulatory Visit: Payer: No Typology Code available for payment source | Admitting: Internal Medicine

## 2021-09-13 ENCOUNTER — Encounter: Payer: Self-pay | Admitting: *Deleted

## 2021-09-13 ENCOUNTER — Other Ambulatory Visit (HOSPITAL_COMMUNITY): Payer: Self-pay

## 2021-09-13 ENCOUNTER — Encounter: Payer: Self-pay | Admitting: Internal Medicine

## 2021-09-13 ENCOUNTER — Ambulatory Visit (INDEPENDENT_AMBULATORY_CARE_PROVIDER_SITE_OTHER): Payer: No Typology Code available for payment source | Admitting: Internal Medicine

## 2021-09-13 VITALS — BP 149/81 | HR 74 | Temp 98.5°F | Ht 69.0 in | Wt 217.2 lb

## 2021-09-13 DIAGNOSIS — Z1211 Encounter for screening for malignant neoplasm of colon: Secondary | ICD-10-CM

## 2021-09-13 DIAGNOSIS — Z1212 Encounter for screening for malignant neoplasm of rectum: Secondary | ICD-10-CM | POA: Diagnosis not present

## 2021-09-13 MED ORDER — CLENPIQ 10-3.5-12 MG-GM -GM/175ML PO SOLN
1.0000 | ORAL | 0 refills | Status: DC
  Filled 2021-09-13: qty 350, 1d supply, fill #0

## 2021-09-13 NOTE — Patient Instructions (Signed)
Good to see you again today!  As discussed, we will schedule a average risk screening colonoscopy in the near future at [facility] ([facility])  We will manage diabetes medicine per office protocol.  Further recommendations to follow.

## 2021-09-13 NOTE — H&P (View-Only) (Signed)
Primary Care Physician:  Claretta Fraise, MD Primary Gastroenterologist:  Dr.   Pre-Procedure History & Physical: HPI:  Jim Sullivan is a 66 y.o. male here for consideration of screening colonoscopy.  Patient states he had a couple of colonoscopies 10 years ago in [place] for obscure abdominal pain  - no significant findings.  Recurrent nausea and vomiting.  Ended up with a bowel obstruction and had a exploratory laparotomy;   found to have a Meckel's diverticulum.  Did well until a couple years ago when he presented with a small bowel obstruction.  He was admitted to our hospital.  Obstruction relieved with conservative measures she has done well since that time.  His ability to eat is well-maintained.  He has lost about 75 pounds over the past couple of years trying to become more healthy.  Good bowel function denies constipation, diarrhea or rectal bleeding.  There is no family history of colon cancer.  He has no personal history of colonic polyps.  He recently had trigger finger surgery by Dr. Amedeo Plenty  Past Medical History:  Diagnosis Date   Diabetes mellitus without complication [facility])    Erectile dysfunction    GERD (gastroesophageal reflux disease)    Hyperlipidemia    Hypertension     Past Surgical History:  Procedure Laterality Date   hand surgery Left    HERNIA REPAIR Right 2019   HERNIA REPAIR Left infancy   SMALL INTESTINE SURGERY  2013   Meckels Diverticulum    TRIGGER FINGER RELEASE Left     Prior to Admission medications   Medication Sig Start Date End Date Taking? Authorizing Provider  aspirin EC 81 MG tablet Take 81 mg by mouth every evening.    Yes [provider]  atorvastatin (LIPITOR) 20 MG tablet TAKE 1 TABLET BY MOUTH DAILY 12/06/20 12/06/21 Yes Stacks, Cletus Gash, MD  carvedilol (COREG) 25 MG tablet TAKE 1 TABLET BY MOUTH 2 TIMES DAILY WITH MEALS 08/05/20 10/10/21 Yes Stacks, Cletus Gash, MD  cloNIDine (CATAPRES) 0.2 MG tablet Take 1 tablet (0.2 mg total) by  mouth 2 (two) times daily. 06/21/21 06/21/22 Yes Claretta Fraise, MD  metFORMIN (GLUCOPHAGE-XR) 750 MG 24 hr tablet Take 1 tablet (750 mg total) by mouth 2 (two) times daily. 12/06/20 12/06/21 Yes Stacks, Cletus Gash, MD  sitaGLIPtin (JANUVIA) 100 MG tablet TAKE 1 TABLET BY MOUTH DAILY 06/21/21 06/21/22 Yes Stacks, Cletus Gash, MD  spironolactone (ALDACTONE) 25 MG tablet TAKE 1 TABLET BY MOUTH DAILY 06/21/21 06/21/22 Yes Stacks, Cletus Gash, MD  terazosin (HYTRIN) 5 MG capsule TAKE 1 CAPSULE BY MOUTH AT BEDTIME 06/21/21 06/21/22 Yes Stacks, Cletus Gash, MD  Vitamin D, Ergocalciferol, (DRISDOL) 1.25 MG (50000 UNIT) CAPS capsule TAKE 1 CAPSULE BY MOUTH EVERY 7 DAYS 06/21/21 06/21/22 Yes Claretta Fraise, MD    Allergies as of 09/13/2021 - Review Complete 09/13/2021  Allergen Reaction Noted   Morphine and related Itching 02/20/2018    No family history on file.  Social History   Socioeconomic History   Marital status: Married    Spouse name: Not on file   Number of children: Not on file   Years of education: Not on file   Highest education level: Not on file  Occupational History   Not on file  Tobacco Use   Smoking status: Former    Packs/day: 0.50    Years: 30.00    Total pack years: 15.00    Types: Cigarettes    Quit date: 01/16/2013    Years since quitting: 8.6   Smokeless  tobacco: Never  Vaping Use   Vaping Use: Former  Substance and Sexual Activity   Alcohol use: Yes    Comment: occ   Drug use: Never   Sexual activity: Yes  Other Topics Concern   Not on file  Social History Narrative   Not on file   Social Determinants of Health   Financial Resource Strain: Not on file  Food Insecurity: Not on file  Transportation Needs: Not on file  Physical Activity: Not on file  Stress: Not on file  Social Connections: Not on file  Intimate Partner Violence: Not on file    Review of Systems: See HPI, otherwise negative ROS  Physical Exam: BP (!) 149/81 (BP Location: Right Arm, Patient Position: Sitting, Cuff  Size: Normal)   Pulse 74   Temp 98.5 F (36.9 C) (Temporal)   Ht '5\' 9"'$  (1.753 m)   Wt 217 lb 3.2 oz (98.5 kg)   SpO2 98%   BMI 32.07 kg/m  General:   Alert,  Well-developed, well-nourished, pleasant and cooperative in NAD Neck:  Supple; no masses or thyromegaly. No significant cervical adenopathy. Lungs:  Clear throughout to auscultation.   No wheezes, crackles, or rhonchi. No acute distress. Heart:  Regular rate and rhythm; no murmurs, clicks, rubs,  or gallops. Abdomen: Non-distended, normal bowel sounds.  Soft and nontender without appreciable mass or hepatosplenomegaly.  Pulses:  Normal pulses noted. Extremities:  Without clubbing or edema.  Impression/Plan: 66 year old gentleman here to set up an average risk screening colonoscopy.  History of small bowel obstruction secondary to Meckel's diverticulum and then subsequently adhesions.  Clinically, doing very well from a GI standpoint. We discussed the pros and cons along with the risk and benefits and alternatives of updating colorectal cancer screening via colonoscopy.  Questions been answered.  He is agreeable.  Recommendations:  As discussed, we will schedule a average risk screening colonoscopy in the near future at [facility] ([facility])  We will manage diabetes medicine per office protocol.  Further recommendations to follo  Notice: This dictation was prepared with Dragon dictation along with smaller phrase technology. Any transcriptional errors that result from this process are unintentional and may not be corrected upon review.

## 2021-09-13 NOTE — Progress Notes (Unsigned)
Primary Care Physician:  Mechele Claude, MD Primary Gastroenterologist:  Dr.   Pre-Procedure History & Physical: HPI:  Jim Sullivan is a 66 y.o. male here for consideration of screening colonoscopy.  Patient states he had a couple of colonoscopies 10 years ago in [place] for obscure abdominal pain no significant findings.  Recurrent nausea and vomiting.  Ended up with a bowel obstruction and had a exploratory laparotomy was found to have a Meckel's diverticulum.  Did well until a couple years ago when he presented with a small bowel obstruction.  He was admitted to our hospital.  Obstruction relieved with conservative measures she has done well since that time.  His ability to eat is well-maintained.  He has lost about 75 pounds over the past couple of years trying to become more healthy.  Good bowel function denies constipation, diarrhea or rectal bleeding.  There is no family history of colon cancer.  He has no personal history of colonic polyps.  He recently had trigger finger surgery by Dr. Amanda Pea  Past Medical History:  Diagnosis Date   Diabetes mellitus without complication [facility])    Erectile dysfunction    GERD (gastroesophageal reflux disease)    Hyperlipidemia    Hypertension     Past Surgical History:  Procedure Laterality Date   hand surgery Left    HERNIA REPAIR Right 2019   HERNIA REPAIR Left infancy   SMALL INTESTINE SURGERY  2013   Meckels Diverticulum    TRIGGER FINGER RELEASE Left     Prior to Admission medications   Medication Sig Start Date End Date Taking? Authorizing Provider  aspirin EC 81 MG tablet Take 81 mg by mouth every evening.    Yes [provider]  atorvastatin (LIPITOR) 20 MG tablet TAKE 1 TABLET BY MOUTH DAILY 12/06/20 12/06/21 Yes Stacks, Broadus John, MD  carvedilol (COREG) 25 MG tablet TAKE 1 TABLET BY MOUTH 2 TIMES DAILY WITH MEALS 08/05/20 10/10/21 Yes Stacks, Broadus John, MD  cloNIDine (CATAPRES) 0.2 MG tablet Take 1 tablet (0.2 mg total) by  mouth 2 (two) times daily. 06/21/21 06/21/22 Yes Mechele Claude, MD  metFORMIN (GLUCOPHAGE-XR) 750 MG 24 hr tablet Take 1 tablet (750 mg total) by mouth 2 (two) times daily. 12/06/20 12/06/21 Yes Stacks, Broadus John, MD  sitaGLIPtin (JANUVIA) 100 MG tablet TAKE 1 TABLET BY MOUTH DAILY 06/21/21 06/21/22 Yes Stacks, Broadus John, MD  spironolactone (ALDACTONE) 25 MG tablet TAKE 1 TABLET BY MOUTH DAILY 06/21/21 06/21/22 Yes Stacks, Broadus John, MD  terazosin (HYTRIN) 5 MG capsule TAKE 1 CAPSULE BY MOUTH AT BEDTIME 06/21/21 06/21/22 Yes Stacks, Broadus John, MD  Vitamin D, Ergocalciferol, (DRISDOL) 1.25 MG (50000 UNIT) CAPS capsule TAKE 1 CAPSULE BY MOUTH EVERY 7 DAYS 06/21/21 06/21/22 Yes Mechele Claude, MD    Allergies as of 09/13/2021 - Review Complete 09/13/2021  Allergen Reaction Noted   Morphine and related Itching 02/20/2018    No family history on file.  Social History   Socioeconomic History   Marital status: Married    Spouse name: Not on file   Number of children: Not on file   Years of education: Not on file   Highest education level: Not on file  Occupational History   Not on file  Tobacco Use   Smoking status: Former    Packs/day: 0.50    Years: 30.00    Total pack years: 15.00    Types: Cigarettes    Quit date: 01/16/2013    Years since quitting: 8.6   Smokeless tobacco: Never  Vaping Use   Vaping Use: Former  Substance and Sexual Activity   Alcohol use: Yes    Comment: occ   Drug use: Never   Sexual activity: Yes  Other Topics Concern   Not on file  Social History Narrative   Not on file   Social Determinants of Health   Financial Resource Strain: Not on file  Food Insecurity: Not on file  Transportation Needs: Not on file  Physical Activity: Not on file  Stress: Not on file  Social Connections: Not on file  Intimate Partner Violence: Not on file    Review of Systems: See HPI, otherwise negative ROS  Physical Exam: BP (!) 149/81 (BP Location: Right Arm, Patient Position: Sitting, Cuff  Size: Normal)   Pulse 74   Temp 98.5 F (36.9 C) (Temporal)   Ht 5\' 9"  (1.753 m)   Wt 217 lb 3.2 oz (98.5 kg)   SpO2 98%   BMI 32.07 kg/m  General:   Alert,  Well-developed, well-nourished, pleasant and cooperative in NAD Neck:  Supple; no masses or thyromegaly. No significant cervical adenopathy. Lungs:  Clear throughout to auscultation.   No wheezes, crackles, or rhonchi. No acute distress. Heart:  Regular rate and rhythm; no murmurs, clicks, rubs,  or gallops. Abdomen: Non-distended, normal bowel sounds.  Soft and nontender without appreciable mass or hepatosplenomegaly.  Pulses:  Normal pulses noted. Extremities:  Without clubbing or edema.  Impression/Plan: 66 year old gentleman here to set up an average risk screening colonoscopy.  History of small bowel obstruction secondary to Meckel's diverticulum and then subsequently adhesions.  Clinically, doing very well from a GI standpoint. We discussed the pros and cons along with the risk and benefits and alternatives of updating colorectal cancer screening via colonoscopy.  Questions been answered.  He is agreeable.  Recommendations:  As discussed, we will schedule a average risk screening colonoscopy in the near future at [facility] ([facility])  We will manage diabetes medicine per office protocol.  Further recommendations to follo  Notice: This dictation was prepared with Dragon dictation along with smaller phrase technology. Any transcriptional errors that result from this process are unintentional and may not be corrected upon review.

## 2021-09-26 ENCOUNTER — Telehealth: Payer: Self-pay | Admitting: *Deleted

## 2021-09-26 NOTE — Telephone Encounter (Signed)
PA:  Authorization #:[id number]  DOS: 10/10/21-11/09/21

## 2021-09-28 ENCOUNTER — Ambulatory Visit (INDEPENDENT_AMBULATORY_CARE_PROVIDER_SITE_OTHER): Payer: No Typology Code available for payment source | Admitting: Family Medicine

## 2021-09-28 ENCOUNTER — Other Ambulatory Visit (HOSPITAL_COMMUNITY): Payer: Self-pay

## 2021-09-28 ENCOUNTER — Encounter: Payer: Self-pay | Admitting: Family Medicine

## 2021-09-28 ENCOUNTER — Telehealth: Payer: Self-pay | Admitting: Family Medicine

## 2021-09-28 VITALS — BP 147/74 | HR 68 | Temp 97.3°F | Ht 69.0 in | Wt 215.0 lb

## 2021-09-28 DIAGNOSIS — E785 Hyperlipidemia, unspecified: Secondary | ICD-10-CM

## 2021-09-28 DIAGNOSIS — E1169 Type 2 diabetes mellitus with other specified complication: Secondary | ICD-10-CM

## 2021-09-28 DIAGNOSIS — I10 Essential (primary) hypertension: Secondary | ICD-10-CM

## 2021-09-28 DIAGNOSIS — Z23 Encounter for immunization: Secondary | ICD-10-CM | POA: Diagnosis not present

## 2021-09-28 LAB — BAYER DCA HB A1C WAIVED: HB A1C (BAYER DCA - WAIVED): 6.7 % — ABNORMAL HIGH (ref 4.8–5.6)

## 2021-09-28 MED ORDER — CARVEDILOL 25 MG PO TABS: ORAL_TABLET | ORAL | 3 refills | Status: DC

## 2021-09-28 MED ORDER — FREESTYLE LITE TEST VI STRP
ORAL_STRIP | 12 refills | Status: DC
  Filled 2021-09-28: qty 100, 30d supply, fill #0

## 2021-09-28 MED ORDER — ECONAZOLE NITRATE 1 % EX CREA: TOPICAL_CREAM | Freq: Two times a day (BID) | CUTANEOUS | 1 refills | Status: DC

## 2021-09-28 MED ORDER — ATORVASTATIN CALCIUM 20 MG PO TABS: ORAL_TABLET | Freq: Every day | ORAL | 3 refills | Status: DC

## 2021-09-28 MED ORDER — ATORVASTATIN CALCIUM 20 MG PO TABS
ORAL_TABLET | Freq: Every day | ORAL | 3 refills | Status: DC
  Filled 2021-09-28: qty 90, fill #0

## 2021-09-28 MED ORDER — OLMESARTAN MEDOXOMIL 20 MG PO TABS
20.0000 mg | ORAL_TABLET | Freq: Every day | ORAL | 1 refills | Status: DC
  Filled 2021-09-28: qty 90, 90d supply, fill #0

## 2021-09-28 MED ORDER — FREESTYLE LITE TEST VI STRP: ORAL_STRIP | 12 refills | Status: DC

## 2021-09-28 MED ORDER — ECONAZOLE NITRATE 1 % EX CREA
TOPICAL_CREAM | Freq: Two times a day (BID) | CUTANEOUS | 1 refills | Status: DC
  Filled 2021-09-28: qty 30, fill #0

## 2021-09-28 MED ORDER — CARVEDILOL 25 MG PO TABS
ORAL_TABLET | ORAL | 3 refills | Status: DC
  Filled 2021-09-28: qty 180, 90d supply, fill #0
  Filled 2022-01-24: qty 180, 90d supply, fill #1
  Filled 2022-04-18: qty 180, 90d supply, fill #2
  Filled 2022-07-24: qty 180, 90d supply, fill #3

## 2021-09-28 MED ORDER — OLMESARTAN MEDOXOMIL 20 MG PO TABS: 20.0000 mg | ORAL_TABLET | Freq: Every day | ORAL | 1 refills | Status: DC

## 2021-09-28 NOTE — Telephone Encounter (Signed)
Meds sent to [facility] in [place]

## 2021-09-28 NOTE — Addendum Note (Signed)
Addended by: Everlean Cherry on: 09/28/2021 12:21 PM   Modules accepted: Orders

## 2021-09-28 NOTE — Addendum Note (Signed)
Addended by: Everlean Cherry on: 09/28/2021 03:16 PM   Modules accepted: Orders

## 2021-09-28 NOTE — Progress Notes (Signed)
Subjective:  Patient ID: Jim Sullivan,  male    DOB: 1955/05/16  Age: 66 y.o.    CC: Medical Management of Chronic Issues   HPI Jim Sullivan presents for  follow-up of hypertension. Patient has no history of headache chest pain or shortness of breath or recent cough. Patient also denies symptoms of TIA such as numbness weakness lateralizing. Patient denies side effects from medication. States taking it regularly. Hasn't taken BP meds yet today. BP readings at home 165/68.  Patient also  in for follow-up of elevated cholesterol. Doing well without complaints on current medication. Denies side effects  including myalgia and arthralgia and nausea. Also in today for liver function testing. Currently no chest pain, shortness of breath or other cardiovascular related symptoms noted.  Follow-up of diabetes. Patient does check blood sugar at home. Readings run between 127 and not checking prandial.  Patient denies symptoms such as excessive hunger or urinary frequency, excessive hunger, nausea No significant hypoglycemic spells noted. Medications reviewed. Pt reports taking them regularly. Pt. denies complication/adverse reaction today.    History Cataldo has a past medical history of Diabetes mellitus without complication ([place]), Erectile dysfunction, GERD (gastroesophageal reflux disease), Hyperlipidemia, and Hypertension.   He has a past surgical history that includes Hernia repair (Right, 2019); Hernia repair (Left, infancy); Small intestine surgery (2013); Trigger finger release (Left); and hand surgery (Left).   His family history is not on file.He reports that he quit smoking about 8 years ago. His smoking use included cigarettes. He has a 15.00 pack-year smoking history. He has never used smokeless tobacco. He reports current alcohol use. He reports that he does not use drugs.  Current Outpatient Medications on File Prior to Visit  Medication Sig Dispense Refill   aspirin EC 81 MG  tablet Take 81 mg by mouth every evening.      cloNIDine (CATAPRES) 0.2 MG tablet Take 1 tablet (0.2 mg total) by mouth 2 (two) times daily. 180 tablet 3   metFORMIN (GLUCOPHAGE-XR) 750 MG 24 hr tablet Take 1 tablet (750 mg total) by mouth 2 (two) times daily. 180 tablet 3   sitaGLIPtin (JANUVIA) 100 MG tablet TAKE 1 TABLET BY MOUTH DAILY 90 tablet 3   Sod Picosulfate-Mag Ox-Cit Acd (CLENPIQ) 10-3.5-12 MG-GM -GM/175ML SOLN Take 1 kit by mouth as directed. 350 mL 0   spironolactone (ALDACTONE) 25 MG tablet TAKE 1 TABLET BY MOUTH DAILY 90 tablet 3   terazosin (HYTRIN) 5 MG capsule TAKE 1 CAPSULE BY MOUTH AT BEDTIME 90 capsule 3   Vitamin D, Ergocalciferol, (DRISDOL) 1.25 MG (50000 UNIT) CAPS capsule TAKE 1 CAPSULE BY MOUTH EVERY 7 DAYS 12 capsule 3   No current facility-administered medications on file prior to visit.    ROS Review of Systems  Constitutional:  Negative for fever.  Respiratory:  Negative for shortness of breath.   Cardiovascular:  Negative for chest pain.  Musculoskeletal:  Negative for arthralgias.  Skin:  Positive for rash (at groin fold. Has responded to clotrimazole before.Not clearing as well this time).    Objective:  BP (!) 147/74   Pulse 68   Temp (!) 97.3 F (36.3 C)   Ht _0  (1.753 m)   Wt 215 lb (97.5 kg)   SpO2 99%   BMI 31.75 kg/m   BP Readings from Last 3 Encounters:  09/28/21 (!) 147/74  09/13/21 (!) 149/81  06/21/21 (!) 161/79    Wt Readings from Last 3 Encounters:  09/28/21 215 lb (97.5 kg)  09/13/21 217 lb 3.2 oz (98.5 kg)  06/21/21 227 lb 12.8 oz (103.3 kg)     Physical Exam Vitals reviewed.  Constitutional:      Appearance: He is well-developed.  HENT:     Head: Normocephalic and atraumatic.     Right Ear: External ear normal.     Left Ear: External ear normal.     Mouth/Throat:     Pharynx: No oropharyngeal exudate or posterior oropharyngeal erythema.  Eyes:     Pupils: Pupils are equal, round, and reactive to light.   Cardiovascular:     Rate and Rhythm: Normal rate and regular rhythm.     Heart sounds: No murmur heard. Pulmonary:     Effort: No respiratory distress.     Breath sounds: Normal breath sounds.  Musculoskeletal:     Cervical back: Normal range of motion and neck supple.  Skin:    Findings: Rash (erythema at left groin and perineum) present.  Neurological:     Mental Status: He is alert and oriented to person, place, and time.     Diabetic Foot Exam - Simple   No data filed     Lab Results  Component Value Date   HGBA1C 6.8 (H) 06/21/2021   HGBA1C 7.9 (H) 12/06/2020   HGBA1C 7.9 (H) 08/05/2020    Assessment & Plan:   Jim Sullivan was seen today for medical management of chronic issues.  Diagnoses and all orders for this visit:  Type 2 diabetes mellitus with other specified complication, without long-term current use of insulin ([place]) -     Bayer DCA Hb A1c Waived  Essential hypertension -     CBC with Differential/Platelet -     CMP14+EGFR -     carvedilol (COREG) 25 MG tablet; TAKE 1 TABLET BY MOUTH 2 TIMES DAILY WITH MEALS  Hyperlipidemia, unspecified hyperlipidemia type -     Lipid panel -     atorvastatin (LIPITOR) 20 MG tablet; TAKE 1 TABLET BY MOUTH DAILY  Need for pneumococcal vaccination -     Pneumococcal conjugate vaccine 13-valent  Other orders -     olmesartan (BENICAR) 20 MG tablet; Take 1 tablet (20 mg total) by mouth daily. For blood pressure -     Discontinue: econazole nitrate 1 % cream; Apply topically 2 (two) times daily. To affected areas -     econazole nitrate 1 % cream; Apply topically 2 (two) times daily. To affected areas   I am having [name] start on olmesartan. I am also having him maintain his aspirin EC, metFORMIN, cloNIDine, sitaGLIPtin, spironolactone, terazosin, Vitamin D (Ergocalciferol), Clenpiq, atorvastatin, carvedilol, and econazole nitrate.  Meds ordered this encounter  Medications   atorvastatin (LIPITOR) 20 MG tablet     Sig: TAKE 1 TABLET BY MOUTH DAILY    Dispense:  90 tablet    Refill:  3   carvedilol (COREG) 25 MG tablet    Sig: TAKE 1 TABLET BY MOUTH 2 TIMES DAILY WITH MEALS    Dispense:  180 tablet    Refill:  3   olmesartan (BENICAR) 20 MG tablet    Sig: Take 1 tablet (20 mg total) by mouth daily. For blood pressure    Dispense:  90 tablet    Refill:  1   DISCONTD: econazole nitrate 1 % cream    Sig: Apply topically 2 (two) times daily. To affected areas    Dispense:  30 g    Refill:  1   econazole nitrate 1 %  cream    Sig: Apply topically 2 (two) times daily. To affected areas    Dispense:  30 g    Refill:  1     Follow-up: Return in about 3 months (around 12/28/2021).  Claretta Fraise, M.D.

## 2021-09-29 LAB — CMP14+EGFR
ALT: 12 IU/L (ref 0–44)
AST: 17 IU/L (ref 0–40)
Albumin/Globulin Ratio: 1.4 (ref 1.2–2.2)
Albumin: 4.2 g/dL (ref 3.9–4.9)
Alkaline Phosphatase: 110 IU/L (ref 44–121)
BUN/Creatinine Ratio: 10 (ref 10–24)
BUN: 22 mg/dL (ref 8–27)
Bilirubin Total: 0.3 mg/dL (ref 0.0–1.2)
CO2: 23 mmol/L (ref 20–29)
Calcium: 10.4 mg/dL — ABNORMAL HIGH (ref 8.6–10.2)
Chloride: 99 mmol/L (ref 96–106)
Creatinine, Ser: 2.27 mg/dL — ABNORMAL HIGH (ref 0.76–1.27)
Globulin, Total: 2.9 g/dL (ref 1.5–4.5)
Glucose: 176 mg/dL — ABNORMAL HIGH (ref 70–99)
Potassium: 5.8 mmol/L — ABNORMAL HIGH (ref 3.5–5.2)
Sodium: 137 mmol/L (ref 134–144)
Total Protein: 7.1 g/dL (ref 6.0–8.5)
eGFR: 31 mL/min/{1.73_m2} — ABNORMAL LOW (ref >59)

## 2021-09-29 LAB — CBC WITH DIFFERENTIAL/PLATELET
Basophils Absolute: 0.1 10*3/uL (ref 0.0–0.2)
Basos: 1 %
EOS (ABSOLUTE): 0.2 10*3/uL (ref 0.0–0.4)
Eos: 3 %
Hematocrit: 33.5 % — ABNORMAL LOW (ref 37.5–51.0)
Hemoglobin: 11.2 g/dL — ABNORMAL LOW (ref 13.0–17.7)
Immature Grans (Abs): 0 10*3/uL (ref 0.0–0.1)
Immature Granulocytes: 0 %
Lymphocytes Absolute: 1.4 10*3/uL (ref 0.7–3.1)
Lymphs: 18 %
MCH: 30.9 pg (ref 26.6–33.0)
MCHC: 33.4 g/dL (ref 31.5–35.7)
MCV: 92 fL (ref 79–97)
Monocytes Absolute: 0.5 10*3/uL (ref 0.1–0.9)
Monocytes: 7 %
Neutrophils Absolute: 5.6 10*3/uL (ref 1.4–7.0)
Neutrophils: 71 %
Platelets: 425 10*3/uL (ref 150–450)
RBC: 3.63 x10E6/uL — ABNORMAL LOW (ref 4.14–5.80)
RDW: 12.4 % (ref 11.6–15.4)
WBC: 7.9 10*3/uL (ref 3.4–10.8)

## 2021-09-29 LAB — LIPID PANEL
Chol/HDL Ratio: 4.4 ratio (ref 0.0–5.0)
Cholesterol, Total: 118 mg/dL (ref 100–199)
HDL: 27 mg/dL — ABNORMAL LOW (ref >39)
LDL Chol Calc (NIH): 61 mg/dL (ref 0–99)
Triglycerides: 179 mg/dL — ABNORMAL HIGH (ref 0–149)
VLDL Cholesterol Cal: 30 mg/dL (ref 5–40)

## 2021-10-10 ENCOUNTER — Other Ambulatory Visit: Payer: Self-pay

## 2021-10-10 ENCOUNTER — Encounter (HOSPITAL_COMMUNITY): Payer: Self-pay | Admitting: Internal Medicine

## 2021-10-10 ENCOUNTER — Ambulatory Visit (HOSPITAL_COMMUNITY): Payer: No Typology Code available for payment source | Admitting: Anesthesiology

## 2021-10-10 ENCOUNTER — Ambulatory Visit (HOSPITAL_BASED_OUTPATIENT_CLINIC_OR_DEPARTMENT_OTHER): Payer: No Typology Code available for payment source | Admitting: Anesthesiology

## 2021-10-10 ENCOUNTER — Encounter (HOSPITAL_COMMUNITY)
Admission: RE | Disposition: A | Discharge status: Home / Self Care | Payer: Self-pay | Source: Home / Self Care | Attending: Internal Medicine

## 2021-10-10 ENCOUNTER — Ambulatory Visit (HOSPITAL_COMMUNITY)
Admission: RE | Admit: 2021-10-10 | Discharge: 2021-10-10 | Disposition: A | Discharge status: Home / Self Care | Payer: No Typology Code available for payment source | Attending: Internal Medicine | Admitting: Internal Medicine

## 2021-10-10 DIAGNOSIS — D122 Benign neoplasm of ascending colon: Secondary | ICD-10-CM | POA: Diagnosis not present

## 2021-10-10 DIAGNOSIS — N189 Chronic kidney disease, unspecified: Secondary | ICD-10-CM | POA: Diagnosis not present

## 2021-10-10 DIAGNOSIS — E1122 Type 2 diabetes mellitus with diabetic chronic kidney disease: Secondary | ICD-10-CM | POA: Diagnosis not present

## 2021-10-10 DIAGNOSIS — I129 Hypertensive chronic kidney disease with stage 1 through stage 4 chronic kidney disease, or unspecified chronic kidney disease: Secondary | ICD-10-CM | POA: Diagnosis not present

## 2021-10-10 DIAGNOSIS — Z87891 Personal history of nicotine dependence: Secondary | ICD-10-CM | POA: Diagnosis not present

## 2021-10-10 DIAGNOSIS — K635 Polyp of colon: Secondary | ICD-10-CM | POA: Diagnosis not present

## 2021-10-10 DIAGNOSIS — Z1211 Encounter for screening for malignant neoplasm of colon: Secondary | ICD-10-CM | POA: Diagnosis not present

## 2021-10-10 DIAGNOSIS — D123 Benign neoplasm of transverse colon: Secondary | ICD-10-CM | POA: Diagnosis not present

## 2021-10-10 DIAGNOSIS — Z1212 Encounter for screening for malignant neoplasm of rectum: Secondary | ICD-10-CM

## 2021-10-10 DIAGNOSIS — K649 Unspecified hemorrhoids: Secondary | ICD-10-CM | POA: Diagnosis not present

## 2021-10-10 DIAGNOSIS — Z8719 Personal history of other diseases of the digestive system: Secondary | ICD-10-CM | POA: Diagnosis not present

## 2021-10-10 DIAGNOSIS — D124 Benign neoplasm of descending colon: Secondary | ICD-10-CM | POA: Insufficient documentation

## 2021-10-10 HISTORY — PX: POLYPECTOMY: SHX5525

## 2021-10-10 HISTORY — PX: COLONOSCOPY WITH PROPOFOL: SHX5780

## 2021-10-10 LAB — GLUCOSE, CAPILLARY: Glucose-Capillary: 140 mg/dL — ABNORMAL HIGH (ref 70–99)

## 2021-10-10 SURGERY — COLONOSCOPY WITH PROPOFOL
Anesthesia: General

## 2021-10-10 MED ORDER — LIDOCAINE HCL (CARDIAC) PF 100 MG/5ML IV SOSY
PREFILLED_SYRINGE | INTRAVENOUS | Status: DC | PRN
  Administered 2021-10-10: 50 mg via INTRATRACHEAL

## 2021-10-10 MED ORDER — STERILE WATER FOR IRRIGATION IR SOLN
Status: DC | PRN
  Administered 2021-10-10: .6 mL

## 2021-10-10 MED ORDER — EPHEDRINE SULFATE-NACL 50-0.9 MG/10ML-% IV SOSY
PREFILLED_SYRINGE | INTRAVENOUS | Status: DC | PRN
  Administered 2021-10-10: 10 mg via INTRAVENOUS
  Administered 2021-10-10: 15 mg via INTRAVENOUS

## 2021-10-10 MED ORDER — LACTATED RINGERS IV SOLN: INTRAVENOUS | Status: DC | PRN

## 2021-10-10 MED ORDER — PROPOFOL 10 MG/ML IV BOLUS
INTRAVENOUS | Status: DC | PRN
  Administered 2021-10-10: 100 mg via INTRAVENOUS

## 2021-10-10 MED ORDER — LACTATED RINGERS IV SOLN: INTRAVENOUS | Status: DC

## 2021-10-10 MED ORDER — PROPOFOL 500 MG/50ML IV EMUL
INTRAVENOUS | Status: DC | PRN
  Administered 2021-10-10: 200 ug/kg/min via INTRAVENOUS

## 2021-10-10 MED ORDER — PHENYLEPHRINE 80 MCG/ML (10ML) SYRINGE FOR IV PUSH (FOR BLOOD PRESSURE SUPPORT)
PREFILLED_SYRINGE | INTRAVENOUS | Status: DC | PRN
  Administered 2021-10-10: 160 ug via INTRAVENOUS
  Administered 2021-10-10 (×2): 80 ug via INTRAVENOUS
  Administered 2021-10-10: 160 ug via INTRAVENOUS

## 2021-10-10 NOTE — Anesthesia Preprocedure Evaluation (Signed)
Anesthesia Evaluation  Patient identified by MRN, date of birth, ID band Patient awake    Reviewed: Allergy & Precautions, H&P , NPO status , Patient's Chart, lab work & pertinent test results, reviewed documented beta blocker date and time   Airway Mallampati: II  TM Distance: >3 FB Neck ROM: full    Dental no notable dental hx.    Pulmonary neg pulmonary ROS, former smoker,    Pulmonary exam normal breath sounds clear to auscultation       Cardiovascular Exercise Tolerance: Good hypertension, negative cardio ROS   Rhythm:regular Rate:Normal     Neuro/Psych negative neurological ROS  negative psych ROS   GI/Hepatic Neg liver ROS, GERD  Medicated,  Endo/Other  negative endocrine ROSdiabetes, Type 2  Renal/GU CRFRenal disease  negative genitourinary   Musculoskeletal   Abdominal   Peds  Hematology negative hematology ROS (+)   Anesthesia Other Findings   Reproductive/Obstetrics negative OB ROS                             Anesthesia Physical Anesthesia Plan  ASA: 2  Anesthesia Plan: General   Post-op Pain Management:    Induction:   PONV Risk Score and Plan: Propofol infusion  Airway Management Planned:   Additional Equipment:   Intra-op Plan:   Post-operative Plan:   Informed Consent: I have reviewed the patients History and Physical, chart, labs and discussed the procedure including the risks, benefits and alternatives for the proposed anesthesia with the patient or authorized representative who has indicated his/her understanding and acceptance.     Dental Advisory Given  Plan Discussed with: CRNA  Anesthesia Plan Comments:         Anesthesia Quick Evaluation

## 2021-10-10 NOTE — Interval H&P Note (Signed)
History and Physical Interval Note:  10/10/2021 12:49 PM  Jim Sullivan  has presented today for surgery, with the diagnosis of screening.  The various methods of treatment have been discussed with the patient and family. After consideration of risks, benefits and other options for treatment, the patient has consented to  Procedure(s) with comments: COLONOSCOPY WITH PROPOFOL (N/A) - 1:00pm as a surgical intervention.  The patient's history has been reviewed, patient examined, no change in status, stable for surgery.  I have reviewed the patient's chart and labs.  Questions were answered to the patient's satisfaction.     Vence Lalor  No change.  The patient is . prepped.  Ready for an average risk screening colonoscopy today.  The risks, benefits, limitations, alternatives and imponderables have been reviewed with the patient. Questions have been answered. All parties are agreeable.

## 2021-10-10 NOTE — Discharge Instructions (Signed)
  Colonoscopy Discharge Instructions  Read the instructions outlined below and refer to this sheet in the next few weeks. These discharge instructions provide you with general information on caring for yourself after you leave the hospital. Your doctor may also give you specific instructions. While your treatment has been planned according to the most current medical practices available, unavoidable complications occasionally occur. If you have any problems or questions after discharge, call Dr. Gala Romney at [phone]. ACTIVITY You may resume your regular activity, but move at a slower pace for the next 24 hours.  Take frequent rest periods for the next 24 hours.  Walking will help get rid of the air and reduce the bloated feeling in your belly (abdomen).  No driving for 24 hours (because of the medicine (anesthesia) used during the test).   Do not sign any important legal documents or operate any machinery for 24 hours (because of the anesthesia used during the test).  NUTRITION Drink plenty of fluids.  You may resume your normal diet as instructed by your doctor.  Begin with a light meal and progress to your normal diet. Heavy or fried foods are harder to digest and may make you feel sick to your stomach (nauseated).  Avoid alcoholic beverages for 24 hours or as instructed.  MEDICATIONS You may resume your normal medications unless your doctor tells you otherwise.  WHAT YOU CAN EXPECT TODAY Some feelings of bloating in the abdomen.  Passage of more gas than usual.  Spotting of blood in your stool or on the toilet paper.  IF YOU HAD POLYPS REMOVED DURING THE COLONOSCOPY: No aspirin products for 7 days or as instructed.  No alcohol for 7 days or as instructed.  Eat a soft diet for the next 24 hours.  FINDING OUT THE RESULTS OF YOUR TEST Not all test results are available during your visit. If your test results are not back during the visit, make an appointment with your caregiver to find out the  results. Do not assume everything is normal if you have not heard from your caregiver or the medical facility. It is important for you to follow up on all of your test results.  SEEK IMMEDIATE MEDICAL ATTENTION IF: You have more than a spotting of blood in your stool.  Your belly is swollen (abdominal distention).  You are nauseated or vomiting.  You have a temperature over 101.  You have abdominal pain or discomfort that is severe or gets worse throughout the day   4 polyps removed from your colon   further recommendations to follow pending review of pathology report

## 2021-10-10 NOTE — Transfer of Care (Signed)
Immediate Anesthesia Transfer of Care Note  Patient: Micky Sheller  Procedure(s) Performed: COLONOSCOPY WITH PROPOFOL POLYPECTOMY  Patient Location: PACU  Anesthesia Type:General  Level of Consciousness: sedated, patient cooperative and responds to stimulation  Airway & Oxygen Therapy: Patient Spontanous Breathing  Post-op Assessment: Report given to RN and Post -op Vital signs reviewed and stable  Post vital signs: Reviewed and stable  Last Vitals:  Vitals Value Taken Time  BP 127/46 10/10/21 1328  Temp 36.4 C 10/10/21 1328  Pulse 68 10/10/21 1328  Resp 18 10/10/21 1328  SpO2 96 % 10/10/21 1328    Last Pain:  Vitals:   10/10/21 1328  TempSrc: Axillary         Complications: No notable events documented.

## 2021-10-10 NOTE — Op Note (Signed)
[facility] Patient Name: Jim Sullivan Procedure Date: 10/10/2021 11:45 AM MRN: [id number] Date of Birth: 07/07/1955 Attending MD: Norvel Richards , MD CSN: [id number] Age: 66 Admit Type: Outpatient Procedure:                Colonoscopy Indications:              Screening for colorectal malignant neoplasm Providers:                Norvel Richards, MD, Janeece Riggers, RN, Casimer Bilis, Technician Referring MD:              Medicines:                Propofol per Anesthesia Complications:            No immediate complications. Estimated Blood Loss:     Estimated blood loss was minimal. Procedure:                Pre-Anesthesia Assessment:                           - Prior to the procedure, a History and Physical                            was performed, and patient medications and                            allergies were reviewed. The patient's tolerance of                            previous anesthesia was also reviewed. The risks                            and benefits of the procedure and the sedation                            options and risks were discussed with the patient.                            All questions were answered, and informed consent                            was obtained. Prior Anticoagulants: The patient has                            taken no previous anticoagulant or antiplatelet                            agents. ASA Grade Assessment: II - A patient with                            mild systemic disease. After reviewing the risks  and benefits, the patient was deemed in                            satisfactory condition to undergo the procedure.                           After obtaining informed consent, the colonoscope                            was passed under direct vision. Throughout the                            procedure, the patient's blood pressure, pulse, and                             oxygen saturations were monitored continuously. The                            [id number]) scope was introduced through the                            anus and advanced to the the cecum, identified by                            appendiceal orifice and ileocecal valve. The                            colonoscopy was performed without difficulty. The                            patient tolerated the procedure well. The quality                            of the bowel preparation was adequate. Scope In: 39:76:73 PM Scope Out: 1:23:09 PM Scope Withdrawal Time: 0 hours 13 minutes 8 seconds  Total Procedure Duration: 0 hours 26 minutes 53 seconds  Findings:      The perianal and digital rectal examinations were normal.      Three semi-pedunculated polyps were found in the descending colon and       ascending colon. The polyps were 5 to 7 mm in size. These polyps were       removed with a cold snare. Resection and retrieval were complete.       Estimated blood loss was minimal.      A 10 mm polyp was found in the splenic flexure. The polyp was       semi-pedunculated. The polyp was removed with a hot snare. Resection and       retrieval were complete. Estimated blood loss was minimal.      The exam was otherwise without abnormality on direct and retroflexion       views. Impression:               - Three 5 to 7 mm polyps in the descending colon  and in the ascending colon, removed with a cold                            snare. Resected and retrieved.                           - One 10 mm polyp at the splenic flexure, removed                            with a hot snare. Resected and retrieved.                           - The examination was otherwise normal on direct                            and retroflexion views. Moderate Sedation:      Moderate (conscious) sedation was personally administered by an       anesthesia professional. The following  parameters were monitored: oxygen       saturation, heart rate, blood pressure, respiratory rate, EKG, adequacy       of pulmonary ventilation, and response to care. Recommendation:           - Patient has a contact number available for                            emergencies. The signs and symptoms of potential                            delayed complications were discussed with the                            patient. Return to normal activities tomorrow.                            Written discharge instructions were provided to the                            patient.                           - Resume previous diet.                           - Continue present medications.                           - Repeat colonoscopy date to be determined after                            pending pathology results are reviewed for                            surveillance.                           - Return to GI office (date  not yet determined). Procedure Code(s):        --- Professional ---                           [id number], Colonoscopy, flexible; with removal of                            tumor(s), polyp(s), or other lesion(s) by snare                            technique Diagnosis Code(s):        --- Professional ---                           K63.5, Polyp of colon                           Z12.11, Encounter for screening for malignant                            neoplasm of colon CPT copyright 2019 [employer]. All rights reserved. The codes documented in this report are preliminary and upon coder review may  be revised to meet current compliance requirements. Cristopher Estimable. Ellinore Merced, MD Norvel Richards, MD 10/10/2021 1:31:27 PM This report has been signed electronically. Number of Addenda: 0

## 2021-10-12 ENCOUNTER — Encounter: Payer: Self-pay | Admitting: Internal Medicine

## 2021-10-12 LAB — SURGICAL PATHOLOGY

## 2021-10-12 NOTE — Anesthesia Postprocedure Evaluation (Signed)
Anesthesia Post Note  Patient: Jim Sullivan  Procedure(s) Performed: COLONOSCOPY WITH PROPOFOL POLYPECTOMY  Patient location during evaluation: Phase II Anesthesia Type: General Level of consciousness: awake Pain management: pain level controlled Vital Signs Assessment: post-procedure vital signs reviewed and stable Respiratory status: spontaneous breathing and respiratory function stable Cardiovascular status: blood pressure returned to baseline and stable Postop Assessment: no headache and no apparent nausea or vomiting Anesthetic complications: no Comments: Late entry   No notable events documented.   Last Vitals:  Vitals:   10/10/21 1328 10/10/21 1333  BP: (!) 127/46 (!) 104/44  Pulse: 68 67  Resp: 18 15  Temp: 36.4 C   SpO2: 96% 96%    Last Pain:  Vitals:   10/10/21 1336  TempSrc:   PainSc: 0-No pain                 Louann Sjogren

## 2021-10-18 ENCOUNTER — Other Ambulatory Visit (HOSPITAL_COMMUNITY): Payer: Self-pay

## 2021-10-18 ENCOUNTER — Encounter (HOSPITAL_COMMUNITY): Payer: Self-pay | Admitting: Internal Medicine

## 2021-10-19 ENCOUNTER — Other Ambulatory Visit (HOSPITAL_COMMUNITY): Payer: Self-pay

## 2021-10-20 ENCOUNTER — Other Ambulatory Visit (HOSPITAL_COMMUNITY): Payer: Self-pay

## 2021-11-07 ENCOUNTER — Telehealth: Payer: Self-pay | Admitting: Family Medicine

## 2021-11-07 ENCOUNTER — Other Ambulatory Visit (HOSPITAL_COMMUNITY): Payer: Self-pay

## 2021-11-07 DIAGNOSIS — E785 Hyperlipidemia, unspecified: Secondary | ICD-10-CM

## 2021-11-07 MED ORDER — ATORVASTATIN CALCIUM 20 MG PO TABS
20.0000 mg | ORAL_TABLET | Freq: Every day | ORAL | 3 refills | Status: DC
  Filled 2021-11-07 – 2021-12-15 (×3): qty 90, 90d supply, fill #0
  Filled 2022-03-14: qty 90, 90d supply, fill #1
  Filled 2022-05-29: qty 90, 90d supply, fill #2
  Filled 2022-09-12: qty 90, 90d supply, fill #3

## 2021-11-07 NOTE — Telephone Encounter (Signed)
Pt says that his rx for atorvastatin (LIPITOR) 20 MG tablet was sent to wrong pharmacy. [facility] not use [facility]. [facility]. All rx should go to [facility] patient [facility].

## 2021-11-07 NOTE — Telephone Encounter (Signed)
Pt wants a call when done.

## 2021-11-07 NOTE — Telephone Encounter (Signed)
LMOVM medication sent to [facility]

## 2021-11-09 ENCOUNTER — Other Ambulatory Visit (HOSPITAL_COMMUNITY): Payer: Self-pay

## 2021-11-09 ENCOUNTER — Telehealth: Payer: Self-pay | Admitting: Family Medicine

## 2021-11-11 NOTE — Telephone Encounter (Signed)
This has already been addressed

## 2021-11-17 ENCOUNTER — Other Ambulatory Visit (HOSPITAL_COMMUNITY): Payer: Self-pay

## 2021-12-15 ENCOUNTER — Other Ambulatory Visit (HOSPITAL_COMMUNITY): Payer: Self-pay

## 2021-12-15 ENCOUNTER — Other Ambulatory Visit: Payer: Self-pay | Admitting: Family Medicine

## 2021-12-15 DIAGNOSIS — I10 Essential (primary) hypertension: Secondary | ICD-10-CM

## 2021-12-16 ENCOUNTER — Other Ambulatory Visit (HOSPITAL_COMMUNITY): Payer: Self-pay

## 2021-12-20 ENCOUNTER — Other Ambulatory Visit (HOSPITAL_COMMUNITY): Payer: Self-pay

## 2022-01-24 ENCOUNTER — Other Ambulatory Visit (HOSPITAL_COMMUNITY): Payer: Self-pay

## 2022-01-24 ENCOUNTER — Other Ambulatory Visit: Payer: Self-pay | Admitting: Family Medicine

## 2022-01-24 DIAGNOSIS — E1169 Type 2 diabetes mellitus with other specified complication: Secondary | ICD-10-CM

## 2022-01-24 MED ORDER — METFORMIN HCL ER 750 MG PO TB24
750.0000 mg | ORAL_TABLET | Freq: Two times a day (BID) | ORAL | 0 refills | Status: DC
  Filled 2022-01-24: qty 60, 30d supply, fill #0

## 2022-01-25 ENCOUNTER — Other Ambulatory Visit: Payer: Self-pay

## 2022-01-26 ENCOUNTER — Other Ambulatory Visit (HOSPITAL_COMMUNITY): Payer: Self-pay

## 2022-02-09 ENCOUNTER — Ambulatory Visit (INDEPENDENT_AMBULATORY_CARE_PROVIDER_SITE_OTHER): Payer: 59 | Admitting: Family Medicine

## 2022-02-09 ENCOUNTER — Other Ambulatory Visit: Payer: Self-pay

## 2022-02-09 ENCOUNTER — Other Ambulatory Visit (HOSPITAL_COMMUNITY): Payer: Self-pay

## 2022-02-09 ENCOUNTER — Encounter: Payer: Self-pay | Admitting: Family Medicine

## 2022-02-09 VITALS — BP 104/60 | HR 64 | Temp 97.6°F | Ht 69.0 in | Wt 209.0 lb

## 2022-02-09 DIAGNOSIS — E1169 Type 2 diabetes mellitus with other specified complication: Secondary | ICD-10-CM

## 2022-02-09 DIAGNOSIS — E785 Hyperlipidemia, unspecified: Secondary | ICD-10-CM | POA: Diagnosis not present

## 2022-02-09 DIAGNOSIS — E559 Vitamin D deficiency, unspecified: Secondary | ICD-10-CM | POA: Diagnosis not present

## 2022-02-09 DIAGNOSIS — I10 Essential (primary) hypertension: Secondary | ICD-10-CM

## 2022-02-09 DIAGNOSIS — Z125 Encounter for screening for malignant neoplasm of prostate: Secondary | ICD-10-CM

## 2022-02-09 LAB — BAYER DCA HB A1C WAIVED: HB A1C (BAYER DCA - WAIVED): 6.8 % — ABNORMAL HIGH (ref 4.8–5.6)

## 2022-02-09 MED ORDER — SPIRONOLACTONE 25 MG PO TABS
25.0000 mg | ORAL_TABLET | Freq: Every day | ORAL | 3 refills | Status: DC
  Filled 2022-02-09: qty 90, fill #0
  Filled 2022-04-18: qty 90, 90d supply, fill #0
  Filled 2022-07-24: qty 90, 90d supply, fill #1
  Filled 2022-10-10: qty 90, 90d supply, fill #2

## 2022-02-09 MED ORDER — OLMESARTAN MEDOXOMIL 20 MG PO TABS
20.0000 mg | ORAL_TABLET | Freq: Every day | ORAL | 3 refills | Status: DC
  Filled 2022-02-09: qty 90, 90d supply, fill #0

## 2022-02-09 MED ORDER — METFORMIN HCL ER 750 MG PO TB24
750.0000 mg | ORAL_TABLET | Freq: Two times a day (BID) | ORAL | 3 refills | Status: DC
  Filled 2022-02-09 – 2022-02-21 (×2): qty 180, 90d supply, fill #0
  Filled 2022-05-29: qty 180, 90d supply, fill #1
  Filled 2022-08-24: qty 180, 90d supply, fill #2

## 2022-02-09 NOTE — Progress Notes (Signed)
Subjective:  Patient ID: Nayel Purdy,  male    DOB: March 12, 1955  Age: 67 y.o.    CC: Medical Management of Chronic Issues   HPI Sparsh Callens presents for  follow-up of hypertension. Patient has no history of headache chest pain or shortness of breath or recent cough. Patient also denies symptoms of TIA such as numbness weakness lateralizing. Patient reports he stopped olmesartan due to orthostasis. BP fine without it.   Patient also  in for follow-up of elevated cholesterol. Doing well without complaints on current medication. Denies side effects  including myalgia and arthralgia and nausea. Also in today for liver function testing. Currently no chest pain, shortness of breath or other cardiovascular related symptoms noted.  Follow-up of diabetes. Patient does check blood sugar at home occasionally. Readings run between 115 and 125 Under 130 except higher at [place].  Patient denies symptoms such as excessive hunger or urinary frequency, excessive hunger, nausea No significant hypoglycemic spells noted. Medications reviewed. Pt reports taking them regularly. Pt. denies complication/adverse reaction today.    Also lo Vitamin D . Taking scrip supplement. Due for lesting today.  History Rainn has a past medical history of Diabetes mellitus without complication ([place]), Erectile dysfunction, GERD (gastroesophageal reflux disease), Hyperlipidemia, and Hypertension.   He has a past surgical history that includes Hernia repair (Right, 2019); Hernia repair (Left, infancy); Small intestine surgery (2013); Trigger finger release (Left); hand surgery (Left); Colonoscopy with propofol (N/A, 10/10/2021); and polypectomy (10/10/2021).   His family history is not on file.He reports that he quit smoking about 9 years ago. His smoking use included cigarettes. He has a 15.00 pack-year smoking history. He has never used smokeless tobacco. He reports current alcohol use. He reports that he does not use  drugs.  Current Outpatient Medications on File Prior to Visit  Medication Sig Dispense Refill   atorvastatin (LIPITOR) 20 MG tablet Take 1 tablet (20 mg total) by mouth daily. 90 tablet 3   carvedilol (COREG) 25 MG tablet TAKE 1 TABLET BY MOUTH 2 TIMES DAILY WITH MEALS 180 tablet 3   carvedilol (COREG) 25 MG tablet TAKE 1 TABLET BY MOUTH 2 TIMES DAILY WITH MEALS 180 tablet 3   cloNIDine (CATAPRES) 0.2 MG tablet Take 1 tablet (0.2 mg total) by mouth 2 (two) times daily. 180 tablet 3   econazole nitrate 1 % cream Apply topically 2 (two) times daily. To affected areas 30 g 1   glucose blood (FREESTYLE LITE) test strip Use as instructed 100 each 12   metFORMIN (GLUCOPHAGE-XR) 750 MG 24 hr tablet Take 1 tablet (750 mg total) by mouth 2 (two) times daily. (NEEDS TO BE SEEN BEFORE NEXT REFILL) 60 tablet 0   Multiple Vitamin (MULTIVITAMIN WITH MINERALS) TABS tablet Take 1 tablet by mouth daily.     olmesartan (BENICAR) 20 MG tablet Take 1 tablet (20 mg total) by mouth daily. For blood pressure 90 tablet 1   sitaGLIPtin (JANUVIA) 100 MG tablet TAKE 1 TABLET BY MOUTH DAILY 90 tablet 3   spironolactone (ALDACTONE) 25 MG tablet TAKE 1 TABLET BY MOUTH DAILY 90 tablet 3   terazosin (HYTRIN) 5 MG capsule TAKE 1 CAPSULE BY MOUTH AT BEDTIME 90 capsule 3   Vitamin D, Ergocalciferol, (DRISDOL) 1.25 MG (50000 UNIT) CAPS capsule TAKE 1 CAPSULE BY MOUTH EVERY 7 DAYS 12 capsule 3   No current facility-administered medications on file prior to visit.    ROS Review of Systems  Objective:  BP 104/60   Pulse  64   Temp 97.6 F (36.4 C)   Ht '5\' 9"'$  (1.753 m)   Wt 209 lb (94.8 kg)   SpO2 99%   BMI 30.86 kg/m   BP Readings from Last 3 Encounters:  02/09/22 104/60  10/10/21 (!) 104/44  09/28/21 (!) 147/74    Wt Readings from Last 3 Encounters:  02/09/22 209 lb (94.8 kg)  09/28/21 215 lb (97.5 kg)  09/13/21 217 lb 3.2 oz (98.5 kg)     Physical Exam  Diabetic Foot Exam - Simple   No data filed      Lab Results  Component Value Date   HGBA1C 6.7 (H) 09/28/2021   HGBA1C 6.8 (H) 06/21/2021   HGBA1C 7.9 (H) 12/06/2020    Assessment & Plan:   Jakwan was seen today for medical management of chronic issues.  Diagnoses and all orders for this visit:  Hyperlipidemia, unspecified hyperlipidemia type -     Lipid panel  Type 2 diabetes mellitus with other specified complication, without long-term current use of insulin (HCC) -     Bayer DCA Hb A1c Waived -     Microalbumin / creatinine urine ratio  Essential hypertension -     CBC with Differential/Platelet -     CMP14+EGFR  Vitamin D deficiency -     VITAMIN D 25 Hydroxy (Vit-D Deficiency, Fractures)  Screening for prostate cancer -     PSA, total and free   I have discontinued Teondre Pacha's aspirin EC, Clenpiq, ibuprofen, and acetaminophen. I am also having him maintain his cloNIDine, sitaGLIPtin, spironolactone, terazosin, Vitamin D (Ergocalciferol), carvedilol, carvedilol, econazole nitrate, olmesartan, FREESTYLE LITE, multivitamin with minerals, atorvastatin, and metFORMIN.  No orders of the defined types were placed in this encounter.    Follow-up: Return in about 3 months (around 05/11/2022).  Claretta Fraise, M.D.

## 2022-02-10 ENCOUNTER — Other Ambulatory Visit (HOSPITAL_COMMUNITY): Payer: Self-pay

## 2022-02-10 LAB — CBC WITH DIFFERENTIAL/PLATELET
Basophils Absolute: 0.1 10*3/uL (ref 0.0–0.2)
Basos: 2 %
EOS (ABSOLUTE): 0.3 10*3/uL (ref 0.0–0.4)
Eos: 5 %
Hematocrit: 31.4 % — ABNORMAL LOW (ref 37.5–51.0)
Hemoglobin: 10.7 g/dL — ABNORMAL LOW (ref 13.0–17.7)
Immature Grans (Abs): 0 10*3/uL (ref 0.0–0.1)
Immature Granulocytes: 0 %
Lymphocytes Absolute: 1.4 10*3/uL (ref 0.7–3.1)
Lymphs: 19 %
MCH: 30.9 pg (ref 26.6–33.0)
MCHC: 34.1 g/dL (ref 31.5–35.7)
MCV: 91 fL (ref 79–97)
Monocytes Absolute: 0.6 10*3/uL (ref 0.1–0.9)
Monocytes: 9 %
Neutrophils Absolute: 4.8 10*3/uL (ref 1.4–7.0)
Neutrophils: 65 %
Platelets: 384 10*3/uL (ref 150–450)
RBC: 3.46 x10E6/uL — ABNORMAL LOW (ref 4.14–5.80)
RDW: 12.4 % (ref 11.6–15.4)
WBC: 7.3 10*3/uL (ref 3.4–10.8)

## 2022-02-10 LAB — CMP14+EGFR
ALT: 13 IU/L (ref 0–44)
AST: 17 IU/L (ref 0–40)
Albumin/Globulin Ratio: 1.4 (ref 1.2–2.2)
Albumin: 4.2 g/dL (ref 3.9–4.9)
Alkaline Phosphatase: 104 IU/L (ref 44–121)
BUN/Creatinine Ratio: 10 (ref 10–24)
BUN: 22 mg/dL (ref 8–27)
Bilirubin Total: 0.3 mg/dL (ref 0.0–1.2)
CO2: 22 mmol/L (ref 20–29)
Calcium: 10.4 mg/dL — ABNORMAL HIGH (ref 8.6–10.2)
Chloride: 100 mmol/L (ref 96–106)
Creatinine, Ser: 2.17 mg/dL — ABNORMAL HIGH (ref 0.76–1.27)
Globulin, Total: 2.9 g/dL (ref 1.5–4.5)
Glucose: 163 mg/dL — ABNORMAL HIGH (ref 70–99)
Potassium: 5.4 mmol/L — ABNORMAL HIGH (ref 3.5–5.2)
Sodium: 137 mmol/L (ref 134–144)
Total Protein: 7.1 g/dL (ref 6.0–8.5)
eGFR: 33 mL/min/{1.73_m2} — ABNORMAL LOW (ref >59)

## 2022-02-10 LAB — VITAMIN D 25 HYDROXY (VIT D DEFICIENCY, FRACTURES): Vit D, 25-Hydroxy: 75.8 ng/mL (ref 30.0–100.0)

## 2022-02-10 LAB — LIPID PANEL
Chol/HDL Ratio: 4.5 ratio (ref 0.0–5.0)
Cholesterol, Total: 127 mg/dL (ref 100–199)
HDL: 28 mg/dL — ABNORMAL LOW (ref >39)
LDL Chol Calc (NIH): 68 mg/dL (ref 0–99)
Triglycerides: 186 mg/dL — ABNORMAL HIGH (ref 0–149)
VLDL Cholesterol Cal: 31 mg/dL (ref 5–40)

## 2022-02-10 LAB — PSA, TOTAL AND FREE
PSA, Free Pct: 48.3 %
PSA, Free: 0.58 ng/mL
Prostate Specific Ag, Serum: 1.2 ng/mL (ref 0.0–4.0)

## 2022-02-11 LAB — MICROALBUMIN / CREATININE URINE RATIO
Creatinine, Urine: 140.8 mg/dL
Microalb/Creat Ratio: 84 mg/g creat — ABNORMAL HIGH (ref 0–29)
Microalbumin, Urine: 118.9 ug/mL

## 2022-02-21 ENCOUNTER — Other Ambulatory Visit: Payer: Self-pay

## 2022-02-21 ENCOUNTER — Other Ambulatory Visit (HOSPITAL_COMMUNITY): Payer: Self-pay

## 2022-03-14 ENCOUNTER — Other Ambulatory Visit: Payer: Self-pay

## 2022-03-17 ENCOUNTER — Other Ambulatory Visit: Payer: Self-pay

## 2022-04-18 ENCOUNTER — Other Ambulatory Visit (HOSPITAL_COMMUNITY): Payer: Self-pay

## 2022-04-18 ENCOUNTER — Other Ambulatory Visit: Payer: Self-pay

## 2022-05-11 DIAGNOSIS — M65332 Trigger finger, left middle finger: Secondary | ICD-10-CM | POA: Diagnosis not present

## 2022-05-11 DIAGNOSIS — M65342 Trigger finger, left ring finger: Secondary | ICD-10-CM | POA: Diagnosis not present

## 2022-05-17 ENCOUNTER — Other Ambulatory Visit (HOSPITAL_COMMUNITY): Payer: Self-pay

## 2022-05-29 ENCOUNTER — Other Ambulatory Visit (HOSPITAL_COMMUNITY): Payer: Self-pay

## 2022-06-15 ENCOUNTER — Other Ambulatory Visit (HOSPITAL_COMMUNITY): Payer: Self-pay

## 2022-06-21 ENCOUNTER — Emergency Department (HOSPITAL_COMMUNITY): Payer: 59

## 2022-06-21 ENCOUNTER — Encounter (HOSPITAL_COMMUNITY): Payer: Self-pay

## 2022-06-21 ENCOUNTER — Other Ambulatory Visit: Payer: Self-pay

## 2022-06-21 ENCOUNTER — Emergency Department (HOSPITAL_COMMUNITY)
Admission: EM | Admit: 2022-06-21 | Discharge: 2022-06-21 | Disposition: A | Discharge status: Home / Self Care | Payer: 59 | Attending: Emergency Medicine | Admitting: Emergency Medicine

## 2022-06-21 DIAGNOSIS — R490 Dysphonia: Secondary | ICD-10-CM | POA: Insufficient documentation

## 2022-06-21 DIAGNOSIS — R0602 Shortness of breath: Secondary | ICD-10-CM | POA: Insufficient documentation

## 2022-06-21 DIAGNOSIS — I10 Essential (primary) hypertension: Secondary | ICD-10-CM | POA: Insufficient documentation

## 2022-06-21 DIAGNOSIS — Z79899 Other long term (current) drug therapy: Secondary | ICD-10-CM | POA: Insufficient documentation

## 2022-06-21 DIAGNOSIS — J029 Acute pharyngitis, unspecified: Secondary | ICD-10-CM | POA: Insufficient documentation

## 2022-06-21 DIAGNOSIS — R059 Cough, unspecified: Secondary | ICD-10-CM | POA: Diagnosis not present

## 2022-06-21 DIAGNOSIS — E119 Type 2 diabetes mellitus without complications: Secondary | ICD-10-CM | POA: Insufficient documentation

## 2022-06-21 DIAGNOSIS — R070 Pain in throat: Secondary | ICD-10-CM | POA: Diagnosis not present

## 2022-06-21 DIAGNOSIS — R0989 Other specified symptoms and signs involving the circulatory and respiratory systems: Secondary | ICD-10-CM | POA: Diagnosis not present

## 2022-06-21 DIAGNOSIS — Z7984 Long term (current) use of oral hypoglycemic drugs: Secondary | ICD-10-CM | POA: Diagnosis not present

## 2022-06-21 DIAGNOSIS — J3489 Other specified disorders of nose and nasal sinuses: Secondary | ICD-10-CM | POA: Diagnosis not present

## 2022-06-21 LAB — COMPREHENSIVE METABOLIC PANEL
ALT: 18 U/L (ref 0–44)
AST: 28 U/L (ref 15–41)
Albumin: 3.8 g/dL (ref 3.5–5.0)
Alkaline Phosphatase: 92 U/L (ref 38–126)
Anion gap: 9 (ref 5–15)
BUN: 28 mg/dL — ABNORMAL HIGH (ref 8–23)
CO2: 22 mmol/L (ref 22–32)
Calcium: 8.9 mg/dL (ref 8.9–10.3)
Chloride: 100 mmol/L (ref 98–111)
Creatinine, Ser: 2.04 mg/dL — ABNORMAL HIGH (ref 0.61–1.24)
GFR, Estimated: 35 mL/min — ABNORMAL LOW (ref >60)
Glucose, Bld: 138 mg/dL — ABNORMAL HIGH (ref 70–99)
Potassium: 4.8 mmol/L (ref 3.5–5.1)
Sodium: 131 mmol/L — ABNORMAL LOW (ref 135–145)
Total Bilirubin: 0.5 mg/dL (ref 0.3–1.2)
Total Protein: 7.1 g/dL (ref 6.5–8.1)

## 2022-06-21 LAB — CBC WITH DIFFERENTIAL/PLATELET
Abs Immature Granulocytes: 0.05 10*3/uL (ref 0.00–0.07)
Basophils Absolute: 0.1 10*3/uL (ref 0.0–0.1)
Basophils Relative: 1 %
Eosinophils Absolute: 0.2 10*3/uL (ref 0.0–0.5)
Eosinophils Relative: 2 %
HCT: 31 % — ABNORMAL LOW (ref 39.0–52.0)
Hemoglobin: 10.3 g/dL — ABNORMAL LOW (ref 13.0–17.0)
Immature Granulocytes: 1 %
Lymphocytes Relative: 6 %
Lymphs Abs: 0.6 10*3/uL — ABNORMAL LOW (ref 0.7–4.0)
MCH: 30.9 pg (ref 26.0–34.0)
MCHC: 33.2 g/dL (ref 30.0–36.0)
MCV: 93.1 fL (ref 80.0–100.0)
Monocytes Absolute: 1.1 10*3/uL — ABNORMAL HIGH (ref 0.1–1.0)
Monocytes Relative: 12 %
Neutro Abs: 7.3 10*3/uL (ref 1.7–7.7)
Neutrophils Relative %: 78 %
Platelets: 327 10*3/uL (ref 150–400)
RBC: 3.33 MIL/uL — ABNORMAL LOW (ref 4.22–5.81)
RDW: 12.5 % (ref 11.5–15.5)
WBC: 9.2 10*3/uL (ref 4.0–10.5)
nRBC: 0 % (ref 0.0–0.2)

## 2022-06-21 LAB — GROUP A STREP BY PCR: Group A Strep by PCR: NOT DETECTED

## 2022-06-21 LAB — CULTURE, BLOOD (ROUTINE X 2): Culture: NO GROWTH

## 2022-06-21 MED ORDER — IOHEXOL 300 MG/ML  SOLN
60.0000 mL | Freq: Once | INTRAMUSCULAR | Status: AC | PRN
  Administered 2022-06-21: 60 mL via INTRAVENOUS

## 2022-06-21 MED ORDER — AMOXICILLIN 500 MG PO CAPS: 500.0000 mg | ORAL_CAPSULE | Freq: Three times a day (TID) | ORAL | 0 refills | Status: DC

## 2022-06-21 MED ORDER — PREDNISONE 20 MG PO TABS: 40.0000 mg | ORAL_TABLET | Freq: Every day | ORAL | 0 refills | Status: DC

## 2022-06-21 MED ORDER — AMOXICILLIN 500 MG PO CAPS
500.0000 mg | ORAL_CAPSULE | Freq: Three times a day (TID) | ORAL | 0 refills | Status: DC
  Filled 2022-06-21: qty 30, 10d supply, fill #0

## 2022-06-21 MED ORDER — KETOROLAC TROMETHAMINE 30 MG/ML IJ SOLN
15.0000 mg | Freq: Once | INTRAMUSCULAR | Status: DC
  Filled 2022-06-21: qty 1

## 2022-06-21 MED ORDER — METHYLPREDNISOLONE SODIUM SUCC 125 MG IJ SOLR
125.0000 mg | Freq: Once | INTRAMUSCULAR | Status: AC
  Administered 2022-06-21: 125 mg via INTRAVENOUS
  Filled 2022-06-21: qty 2

## 2022-06-21 MED ORDER — PREDNISONE 20 MG PO TABS
40.0000 mg | ORAL_TABLET | Freq: Every day | ORAL | 0 refills | Status: DC
  Filled 2022-06-21: qty 10, 5d supply, fill #0

## 2022-06-21 NOTE — ED Notes (Signed)
Patient transported to CT 

## 2022-06-21 NOTE — Discharge Instructions (Signed)
Your testing was reassuring, there is no signs of severe infection in your throat, if this worsens return to the emergency department immediately otherwise take the following medications  Amoxicillin 3 times daily for 10 days    Prednisone is a steroid that helps to reduce certain types of inflammation and may be used for allergic reactions, some rashes such as poison ivy or dermatitis, for asthma attacks or bronchitis and for certain types of pain.  Please take this medicine exactly as prescribed - 40mg  by mouth daily for 5 days.  This can have certain side effects with some people including feeling like you can't sleep, feeling anxious or feeling like you are on a "high".  It should not cause weight gain if only taken for a short time.  Please be aware that this medication may also cause an elevation in your blood sugar if you are a diabetic so if you are a diabetic you will need to keep a very close eye on your blood sugar, make sure that you are eating an extremely low level of carbohydrates and taking your medications exactly as prescribed.  If you should develop severe high blood sugar or start to feel poorly return to the emergency department immediately   Thank you for allowing [place] to treat you in the emergency department today.  After reviewing your examination and potential testing that was done it appears that you are safe to go home.  I would like for you to follow-up with your doctor within the next several days, have them obtain your results and follow-up with them to review all of these tests.  If you should develop severe or worsening symptoms return to the emergency department immediately

## 2022-06-21 NOTE — ED Provider Notes (Signed)
[facility] EMERGENCY DEPARTMENT AT [facility] Provider Note   CSN: [id number] Arrival date & time: 06/21/22  0535     History  Chief Complaint  Patient presents with   Shortness of Breath    Jyair Hammersley is a 67 y.o. male.  Six 67-year-old male with history of diabetes, coronary disease, hypertension who presents ER today with a dyne aphasia, hoarse voice and the feeling like his throat is tight.  States he had a sore throat and what sounds like rigors and chills for couple days.  Got worse tonight.  Was confused earlier today.  Wife brought him here for further evaluation.  Patient states he never had a sore throat this bad.  States right now when he is relaxing in the bed in his current position he does not feel too bad.  Did have some pain with swallowing solids as well.  Denies cough or sick contacts.  Vapes but not nicotine or other substances.   Shortness of Breath      Home Medications Prior to Admission medications   Medication Sig Start Date End Date Taking? Authorizing Provider  atorvastatin (LIPITOR) 20 MG tablet Take 1 tablet (20 mg total) by mouth daily. 11/07/21   Mechele Claude, MD  carvedilol (COREG) 25 MG tablet TAKE 1 TABLET BY MOUTH 2 TIMES DAILY WITH MEALS 09/28/21 09/28/22  Mechele Claude, MD  carvedilol (COREG) 25 MG tablet TAKE 1 TABLET BY MOUTH 2 TIMES DAILY WITH MEALS 09/28/21 09/28/22  Mechele Claude, MD  cloNIDine (CATAPRES) 0.2 MG tablet Take 1 tablet (0.2 mg total) by mouth 2 (two) times daily. 06/21/21 08/16/22  Mechele Claude, MD  econazole nitrate 1 % cream Apply topically 2 (two) times daily. To affected areas 09/28/21   Mechele Claude, MD  glucose blood (FREESTYLE LITE) test strip Use as instructed 09/28/21   Mechele Claude, MD  metFORMIN (GLUCOPHAGE-XR) 750 MG 24 hr tablet Take 1 tablet (750 mg total) by mouth 2 (two) times daily. (NEEDS TO BE SEEN BEFORE NEXT REFILL) 02/09/22   Mechele Claude, MD  Multiple Vitamin (MULTIVITAMIN WITH MINERALS)  TABS tablet Take 1 tablet by mouth daily.    [provider]  sitaGLIPtin (JANUVIA) 100 MG tablet TAKE 1 TABLET BY MOUTH DAILY 06/21/21 07/24/22  Mechele Claude, MD  spironolactone (ALDACTONE) 25 MG tablet Take 1 tablet (25 mg total) by mouth daily. 02/09/22   Mechele Claude, MD  terazosin (HYTRIN) 5 MG capsule TAKE 1 CAPSULE BY MOUTH AT BEDTIME 06/21/21 09/12/22  Mechele Claude, MD  Vitamin D, Ergocalciferol, (DRISDOL) 1.25 MG (50000 UNIT) CAPS capsule TAKE 1 CAPSULE BY MOUTH EVERY 7 DAYS 06/21/21 08/10/22  Mechele Claude, MD      Allergies    Morphine and codeine    Review of Systems   Review of Systems  Respiratory:  Positive for shortness of breath.     Physical Exam Updated Vital Signs BP (!) 147/74 (BP Location: Left Arm)   Pulse 82   Temp (!) 101.1 F (38.4 C) (Oral)   Resp 18   Ht 5\' 9"  (1.753 m)   Wt 97.5 kg   SpO2 100%   BMI 31.75 kg/m  Physical Exam Vitals and nursing note reviewed.  Constitutional:      Appearance: He is well-developed.  HENT:     Head: Normocephalic and atraumatic.     Comments: Mild posterior oropharyngeal erythema, no exudates, no cervical adenopathy.  Uvula is midline.  Voice is noticeably hoarse Cardiovascular:     Rate  and Rhythm: Normal rate.  Pulmonary:     Effort: Pulmonary effort is normal. No respiratory distress.     Breath sounds: No decreased breath sounds, wheezing or rales.  Abdominal:     General: There is no distension.  Musculoskeletal:        General: Normal range of motion.     Cervical back: Normal range of motion.  Neurological:     Mental Status: He is alert.     ED Results / Procedures / Treatments   Labs (all labs ordered are listed, but only abnormal results are displayed) Labs Reviewed  GROUP A STREP BY PCR  CBC WITH DIFFERENTIAL/PLATELET  COMPREHENSIVE METABOLIC PANEL    EKG None  Radiology No results found.  Procedures Procedures    Medications Ordered in ED Medications   methylPREDNISolone sodium succinate (SOLU-MEDROL) 125 mg/2 mL injection 125 mg (has no administration in time range)  ketorolac (TORADOL) 30 MG/ML injection 15 mg (has no administration in time range)    ED Course/ Medical Decision Making/ A&P                             Medical Decision Making Amount and/or Complexity of Data Reviewed Labs: ordered. Radiology: ordered.  Risk Prescription drug management.  Hopefully just laryngitis from his pharyngitis but also concern for possible abscess, epiglotittis or other complication. No VS abnormalities at this time. No longer confused (could have been related to rigors/fever). Does have a history of CKD, so may not be able to get the ct with contrast.  Xray viewed by myself with some bulky arthritic changes to cervical spine that could be impeding airway/esophagus. No obvious abscess. Pending CT.  CT viewed by myself. One area of mild narrowing, no obvious abscess, pending radiology read at time of sign out to Dr. Hyacinth Meeker. VSS and WNL.    Final Clinical Impression(s) / ED Diagnoses Final diagnoses:  None    Rx / DC Orders ED Discharge Orders     None         Kyani Simkin, Barbara Cower, MD 06/21/22 2337

## 2022-06-21 NOTE — ED Provider Notes (Signed)
Patient accepted at change of shift from Dr. Clayborne Dana, awaiting CT scan results on his neck, thankfully this is completely unremarkable without any signs of abscess or swelling.  The patient is aware of the arthritis in his neck.  He will be placed on a short course of prednisone and amoxicillin, he has been advised to avoid others as he is contagious and agreeable to the plan.  Vital signs reviewed, no tachycardia, no hypoxia, normal blood pressure.  I have discussed with the patient at the bedside the results, and the meaning of these results.  They have expressed her understanding to the need for follow-up with primary care physician   Eber Hong, MD 06/21/22 [phone]

## 2022-06-21 NOTE — ED Triage Notes (Signed)
Patient from home for shortness of breath. Reports he started with a sore throat a few days ago, but now it feels like his throat is closing and it is hard to breathe or swallow. Wife reports patient is newly confused. All of this started around 0300 when patient woke up. Upon arrival to ER, patient is alert and oriented x3. Reports sore throat, chest pain, and headache

## 2022-06-21 NOTE — ED Notes (Addendum)
Patient transported to x-ray. ?

## 2022-06-22 ENCOUNTER — Telehealth: Payer: Self-pay

## 2022-06-22 LAB — CULTURE, BLOOD (ROUTINE X 2): Special Requests: ADEQUATE

## 2022-06-22 NOTE — Transitions of Care (Post Inpatient/ED Visit) (Signed)
06/22/2022  Name: Jim Sullivan MRN: [id number] DOB: December 23, 1955  Today's TOC FU Call Status: Today's TOC FU Call Status:: Successful TOC FU Call Competed TOC FU Call Complete Date: 06/22/22  Transition Care Management Follow-up Telephone Call Date of Discharge: 06/21/22 [facility] Type of Discharge: Emergency Department Reason for ED Visit: Other: (etiology) How have you been since you were released from the hospital?: Same Any questions or concerns?: No  Items Reviewed: Did you receive and understand the discharge instructions provided?: Yes Medications obtained,verified, and reconciled?: Yes (Medications Reviewed) Any new allergies since your discharge?: No Dietary orders reviewed?: No Do you have support at home?: Yes People in Home: spouse  Medications Reviewed Today: Medications Reviewed Today     Reviewed by Annabell Sabal, CMA (Certified Medical Assistant) on 06/22/22 at 1136  Med List Status: <None>   Medication Order Taking? Sig Documenting Provider Last Dose Status Informant  amoxicillin (AMOXIL) 500 MG capsule [id number]  Take 1 capsule (500 mg total) by mouth 3 (three) times daily. Eber Hong, MD  Active   atorvastatin (LIPITOR) 20 MG tablet [id number] No Take 1 tablet (20 mg total) by mouth daily. Mechele Claude, MD Taking Active   carvedilol (COREG) 25 MG tablet [id number] No TAKE 1 TABLET BY MOUTH 2 TIMES DAILY WITH MEALS Stacks, Warren, MD Taking Active   carvedilol (COREG) 25 MG tablet [id number] No TAKE 1 TABLET BY MOUTH 2 TIMES DAILY WITH MEALS Stacks, Broadus John, MD Taking Active Self  cloNIDine (CATAPRES) 0.2 MG tablet [id number] No Take 1 tablet (0.2 mg total) by mouth 2 (two) times daily. Mechele Claude, MD Taking Active Self  econazole nitrate 1 % cream [id number] No Apply topically 2 (two) times daily. To affected areas Mechele Claude, MD Taking Active Self  glucose blood (FREESTYLE LITE) test strip [id number] No Use as instructed  Mechele Claude, MD Taking Active Self  metFORMIN (GLUCOPHAGE-XR) 750 MG 24 hr tablet [id number]  Take 1 tablet (750 mg total) by mouth 2 (two) times daily. (NEEDS TO BE SEEN BEFORE NEXT REFILL) Mechele Claude, MD  Active   Multiple Vitamin (MULTIVITAMIN WITH MINERALS) TABS tablet [id number] No Take 1 tablet by mouth daily. [provider] Taking Active Self           Med Note Joella Prince A   Tue Oct 04, 2021  4:57 PM) Pt originally states he takes 6 vitamins a day that his dr is aware of but refused to review with me. He then states he only takes a multivitamin once a day. Unable to confirm which vitamins he takes  predniSONE (DELTASONE) 20 MG tablet [id number]  Take 2 tablets (40 mg total) by mouth daily. Eber Hong, MD  Active   sitaGLIPtin (JANUVIA) 100 MG tablet [id number] No TAKE 1 TABLET BY MOUTH DAILY Stacks, Broadus John, MD Taking Active Self  spironolactone (ALDACTONE) 25 MG tablet [id number]  Take 1 tablet (25 mg total) by mouth daily. Mechele Claude, MD  Active   terazosin (HYTRIN) 5 MG capsule [id number] No TAKE 1 CAPSULE BY MOUTH AT BEDTIME Mechele Claude, MD Taking Active Self  Vitamin D, Ergocalciferol, (DRISDOL) 1.25 MG (50000 UNIT) CAPS capsule [id number] No TAKE 1 CAPSULE BY MOUTH EVERY 7 DAYS Mechele Claude, MD Taking Active Self           Med Note Geri Seminole   Tue Oct 04, 2021  4:52 PM) Sundays             Home Care and Equipment/Supplies: Were Home  Health Services Ordered?: No Any new equipment or medical supplies ordered?: No  Functional Questionnaire: Do you need assistance with bathing/showering or dressing?: No Do you need assistance with meal preparation?: No Do you need assistance with eating?: No Do you have difficulty maintaining continence: No Do you need assistance with getting out of bed/getting out of a chair/moving?: No Do you have difficulty managing or taking your medications?: No  Follow up appointments reviewed: PCP Follow-up appointment  confirmed?: No (patient was driving will call back to [facility] appointment) MD Provider Line Number:[phone] Given: Yes Specialist Hospital Follow-up appointment confirmed?: NA Do you need transportation to your follow-up appointment?: No Do you understand care options if your condition(s) worsen?: Yes-patient verbalized understanding    SIGNATURE Fredirick Maudlin

## 2022-06-23 LAB — CULTURE, BLOOD (ROUTINE X 2): Special Requests: ADEQUATE

## 2022-06-24 LAB — CULTURE, BLOOD (ROUTINE X 2): Culture: NO GROWTH

## 2022-06-27 ENCOUNTER — Other Ambulatory Visit (HOSPITAL_COMMUNITY): Payer: Self-pay

## 2022-06-27 ENCOUNTER — Encounter: Payer: Self-pay | Admitting: Family Medicine

## 2022-06-27 ENCOUNTER — Ambulatory Visit (INDEPENDENT_AMBULATORY_CARE_PROVIDER_SITE_OTHER): Payer: 59 | Admitting: Family Medicine

## 2022-06-27 ENCOUNTER — Other Ambulatory Visit: Payer: Self-pay

## 2022-06-27 VITALS — BP 226/92 | HR 75 | Temp 97.3°F | Ht 69.0 in | Wt 209.2 lb

## 2022-06-27 DIAGNOSIS — F4322 Adjustment disorder with anxiety: Secondary | ICD-10-CM

## 2022-06-27 DIAGNOSIS — J208 Acute bronchitis due to other specified organisms: Secondary | ICD-10-CM

## 2022-06-27 DIAGNOSIS — B9689 Other specified bacterial agents as the cause of diseases classified elsewhere: Secondary | ICD-10-CM | POA: Diagnosis not present

## 2022-06-27 LAB — CULTURE, BLOOD (ROUTINE X 2)

## 2022-06-27 MED ORDER — BENZONATATE 200 MG PO CAPS
200.0000 mg | ORAL_CAPSULE | Freq: Three times a day (TID) | ORAL | 0 refills | Status: DC | PRN
  Filled 2022-06-27: qty 20, 7d supply, fill #0

## 2022-06-27 MED ORDER — MOXIFLOXACIN HCL 400 MG PO TABS
400.0000 mg | ORAL_TABLET | Freq: Every day | ORAL | 0 refills | Status: DC
  Filled 2022-06-27: qty 10, 10d supply, fill #0

## 2022-06-27 NOTE — Progress Notes (Signed)
Subjective:  Patient ID: Jim Sullivan, male    DOB: February 14, 1955  Age: 67 y.o. MRN: [id number]  CC: ER FOLLOW UP   HPI Jim Sullivan presents for E.D. follow up. Had dyspnea and cough plus ST. Improved with prednisone and amoxil.  Dyspnea gone. Productive cough persists. Some purulence.   Exhausted. Son in hospital, ICU for TBI 3 weeks ago - motorcycle accident. [facility]. Had procedure to repair carrotid artery yesterday. Pt. Distraught. Declines sedative.      02/09/2022    9:05 AM 09/28/2021    9:26 AM 06/21/2021    4:14 PM  Depression screen PHQ 2/9  Decreased Interest 0 0 1  Down, Depressed, Hopeless 0 0 1  PHQ - 2 Score 0 0 2  Altered sleeping   1  Tired, decreased energy   1  Change in appetite   1  Feeling bad or failure about yourself    0  Trouble concentrating   0  Moving slowly or fidgety/restless   0  Suicidal thoughts   0  PHQ-9 Score   5  Difficult doing work/chores   Not difficult at all    History Jim Sullivan has a past medical history of Diabetes mellitus without complication (HCC), Erectile dysfunction, GERD (gastroesophageal reflux disease), Hyperlipidemia, and Hypertension.   He has a past surgical history that includes Hernia repair (Right, 2019); Hernia repair (Left, infancy); Small intestine surgery (2013); Trigger finger release (Left); hand surgery (Left); Colonoscopy with propofol (N/A, 10/10/2021); and polypectomy (10/10/2021).   His family history is not on file.He reports that he quit smoking about 9 years ago. His smoking use included cigarettes. He has a 15.00 pack-year smoking history. He has never used smokeless tobacco. He reports current alcohol use. He reports that he does not use drugs.    ROS Review of Systems  Constitutional:  Negative for fever.  HENT: Negative.    Respiratory:  Negative for shortness of breath.   Cardiovascular:  Negative for chest pain.  Musculoskeletal:  Negative for arthralgias.   Skin:  Negative for rash.  Psychiatric/Behavioral:  Positive for agitation, dysphoric mood and sleep disturbance. The patient is nervous/anxious.     Objective:  BP (!) 226/92   Pulse 75   Temp (!) 97.3 F (36.3 C)   Ht 5\' 9"  (1.753 m)   Wt 209 lb 3.2 oz (94.9 kg)   SpO2 100%   BMI 30.89 kg/m   BP Readings from Last 3 Encounters:  06/27/22 (!) 226/92  06/21/22 (!) 137/57  02/09/22 104/60    Wt Readings from Last 3 Encounters:  06/27/22 209 lb 3.2 oz (94.9 kg)  06/21/22 215 lb (97.5 kg)  02/09/22 209 lb (94.8 kg)     Physical Exam Vitals reviewed.  Constitutional:      Appearance: He is well-developed.  HENT:     Head: Normocephalic and atraumatic.     Right Ear: External ear normal.     Left Ear: External ear normal.     Mouth/Throat:     Pharynx: No oropharyngeal exudate or posterior oropharyngeal erythema.  Eyes:     Pupils: Pupils are equal, round, and reactive to light.  Cardiovascular:     Rate and Rhythm: Normal rate and regular rhythm.     Heart sounds: No murmur heard. Pulmonary:     Effort: No respiratory distress.     Breath sounds: Rhonchi (scattered) present.  Musculoskeletal:     Cervical back: Normal range  of motion and neck supple.  Neurological:     Mental Status: He is alert and oriented to person, place, and time.       Assessment & Plan:   Jim Sullivan was seen today for er follow up.  Diagnoses and all orders for this visit:  Acute bacterial bronchitis  Adjustment disorder with anxious mood  Other orders -     moxifloxacin (AVELOX) 400 MG tablet; Take 1 tablet (400 mg total) by mouth daily. -     benzonatate (TESSALON) 200 MG capsule; Take 1 capsule (200 mg total) by mouth 3 (three) times daily as needed for cough.       I have discontinued Jim Sullivan's amoxicillin. I am also having him start on moxifloxacin and benzonatate. Additionally, I am having him maintain his cloNIDine, sitaGLIPtin, terazosin, Vitamin D  (Ergocalciferol), carvedilol, carvedilol, econazole nitrate, FREESTYLE LITE, multivitamin with minerals, atorvastatin, metFORMIN, spironolactone, and predniSONE.  Allergies as of 06/27/2022       Reactions   Morphine And Codeine Itching   Skin crawling        Medication List        Accurate as of June 27, 2022  5:55 PM. If you have any questions, ask your nurse or doctor.          STOP taking these medications    amoxicillin 500 MG capsule Commonly known as: AMOXIL Stopped by: Mechele Claude, MD       TAKE these medications    atorvastatin 20 MG tablet Commonly known as: LIPITOR Take 1 tablet (20 mg total) by mouth daily.   benzonatate 200 MG capsule Commonly known as: TESSALON Take 1 capsule (200 mg total) by mouth 3 (three) times daily as needed for cough. Started by: Mechele Claude, MD   carvedilol 25 MG tablet Commonly known as: COREG TAKE 1 TABLET BY MOUTH 2 TIMES DAILY WITH MEALS   carvedilol 25 MG tablet Commonly known as: COREG TAKE 1 TABLET BY MOUTH 2 TIMES DAILY WITH MEALS   cloNIDine 0.2 MG tablet Commonly known as: CATAPRES Take 1 tablet (0.2 mg total) by mouth 2 (two) times daily.   econazole nitrate 1 % cream Apply topically 2 (two) times daily. To affected areas   FREESTYLE LITE test strip Generic drug: glucose blood Use as instructed   Januvia 100 MG tablet Generic drug: sitaGLIPtin TAKE 1 TABLET BY MOUTH DAILY   metFORMIN 750 MG 24 hr tablet Commonly known as: GLUCOPHAGE-XR Take 1 tablet (750 mg total) by mouth 2 (two) times daily. (NEEDS TO BE SEEN BEFORE NEXT REFILL)   moxifloxacin 400 MG tablet Commonly known as: AVELOX Take 1 tablet (400 mg total) by mouth daily. Started by: Mechele Claude, MD   multivitamin with minerals Tabs tablet Take 1 tablet by mouth daily.   predniSONE 20 MG tablet Commonly known as: DELTASONE Take 2 tablets (40 mg total) by mouth daily.   spironolactone 25 MG tablet Commonly known as:  ALDACTONE Take 1 tablet (25 mg total) by mouth daily.   terazosin 5 MG capsule Commonly known as: HYTRIN TAKE 1 CAPSULE BY MOUTH AT BEDTIME   Vitamin D (Ergocalciferol) 1.25 MG (50000 UNIT) Caps capsule Commonly known as: DRISDOL TAKE 1 CAPSULE BY MOUTH EVERY 7 DAYS         Follow-up: No follow-ups on file.  Mechele Claude, M.D.

## 2022-06-28 ENCOUNTER — Other Ambulatory Visit (HOSPITAL_COMMUNITY): Payer: Self-pay

## 2022-06-30 ENCOUNTER — Encounter: Payer: Self-pay | Admitting: Family Medicine

## 2022-07-03 ENCOUNTER — Other Ambulatory Visit: Payer: Self-pay | Admitting: Family Medicine

## 2022-07-03 MED ORDER — LORAZEPAM 0.5 MG PO TABS: 0.5000 mg | ORAL_TABLET | Freq: Two times a day (BID) | ORAL | 1 refills | Status: DC | PRN

## 2022-07-24 ENCOUNTER — Other Ambulatory Visit (HOSPITAL_COMMUNITY): Payer: Self-pay

## 2022-07-24 ENCOUNTER — Other Ambulatory Visit: Payer: Self-pay | Admitting: Family Medicine

## 2022-07-24 DIAGNOSIS — I10 Essential (primary) hypertension: Secondary | ICD-10-CM

## 2022-07-24 DIAGNOSIS — E1169 Type 2 diabetes mellitus with other specified complication: Secondary | ICD-10-CM

## 2022-07-24 MED ORDER — SITAGLIPTIN PHOSPHATE 100 MG PO TABS
100.0000 mg | ORAL_TABLET | Freq: Every day | ORAL | 0 refills | Status: DC
Start: 2022-07-24 — End: 2022-09-08
  Filled 2022-07-24: qty 30, 30d supply, fill #0

## 2022-07-24 MED ORDER — TERAZOSIN HCL 5 MG PO CAPS
5.0000 mg | ORAL_CAPSULE | Freq: Every day | ORAL | 0 refills | Status: DC
Start: 2022-07-24 — End: 2022-09-08
  Filled 2022-07-24: qty 30, 30d supply, fill #0

## 2022-07-24 MED ORDER — CLONIDINE HCL 0.2 MG PO TABS
0.2000 mg | ORAL_TABLET | Freq: Two times a day (BID) | ORAL | 0 refills | Status: DC
  Filled 2022-07-24 – 2022-08-24 (×3): qty 60, 30d supply, fill #0

## 2022-07-25 ENCOUNTER — Other Ambulatory Visit: Payer: Self-pay

## 2022-07-25 ENCOUNTER — Other Ambulatory Visit (HOSPITAL_COMMUNITY): Payer: Self-pay

## 2022-07-27 ENCOUNTER — Other Ambulatory Visit (HOSPITAL_COMMUNITY): Payer: Self-pay

## 2022-07-27 ENCOUNTER — Other Ambulatory Visit: Payer: Self-pay

## 2022-08-24 ENCOUNTER — Other Ambulatory Visit: Payer: Self-pay | Admitting: Family Medicine

## 2022-08-24 ENCOUNTER — Other Ambulatory Visit: Payer: Self-pay

## 2022-08-24 ENCOUNTER — Other Ambulatory Visit (HOSPITAL_COMMUNITY): Payer: Self-pay

## 2022-08-24 DIAGNOSIS — E1169 Type 2 diabetes mellitus with other specified complication: Secondary | ICD-10-CM

## 2022-08-24 DIAGNOSIS — E559 Vitamin D deficiency, unspecified: Secondary | ICD-10-CM

## 2022-08-29 ENCOUNTER — Other Ambulatory Visit (HOSPITAL_COMMUNITY): Payer: Self-pay

## 2022-09-08 ENCOUNTER — Other Ambulatory Visit (HOSPITAL_COMMUNITY): Payer: Self-pay

## 2022-09-08 ENCOUNTER — Telehealth: Payer: Self-pay | Admitting: Family Medicine

## 2022-09-08 DIAGNOSIS — I10 Essential (primary) hypertension: Secondary | ICD-10-CM

## 2022-09-08 DIAGNOSIS — E559 Vitamin D deficiency, unspecified: Secondary | ICD-10-CM

## 2022-09-08 DIAGNOSIS — E1169 Type 2 diabetes mellitus with other specified complication: Secondary | ICD-10-CM

## 2022-09-08 MED ORDER — VITAMIN D (ERGOCALCIFEROL) 1.25 MG (50000 UNIT) PO CAPS
50000.0000 [IU] | ORAL_CAPSULE | ORAL | 0 refills | Status: DC
Start: 2022-09-08 — End: 2022-12-13
  Filled 2022-09-08: qty 12, 84d supply, fill #0

## 2022-09-08 MED ORDER — TERAZOSIN HCL 5 MG PO CAPS
5.0000 mg | ORAL_CAPSULE | Freq: Every day | ORAL | 0 refills | Status: DC
Start: 2022-09-08 — End: 2022-10-10
  Filled 2022-09-08: qty 30, 30d supply, fill #0

## 2022-09-08 MED ORDER — CLONIDINE HCL 0.2 MG PO TABS
0.2000 mg | ORAL_TABLET | Freq: Two times a day (BID) | ORAL | 0 refills | Status: DC
  Filled 2022-09-08: qty 60, 30d supply, fill #0

## 2022-09-08 MED ORDER — SITAGLIPTIN PHOSPHATE 100 MG PO TABS
100.0000 mg | ORAL_TABLET | Freq: Every day | ORAL | 0 refills | Status: DC
Start: 2022-09-08 — End: 2022-10-10
  Filled 2022-09-08 – 2022-09-12 (×2): qty 30, 30d supply, fill #0

## 2022-09-08 NOTE — Telephone Encounter (Signed)
Apt r/s 09/26/2022

## 2022-09-08 NOTE — Telephone Encounter (Signed)
  Prescription Request  09/08/2022  Is this a "Controlled Substance" medicine?  terazosin (HYTRIN) 5 MG capsule  VITAMIN D 5000 1.25MG  sitaGLIPtin (JANUVIA) 100 MG tablet  cloNIDine (CATAPRES) 0.2 MG tablet   Have you seen your PCP in the last 2 weeks? Apt 09/25/2022  If YES, route message to pool  -  If NO, patient needs to be scheduled for appointment.  What is the name of the medication or equipment? terazosin (HYTRIN) 5 MG capsule  VITAMIN D 5000 1.25MG  sitaGLIPtin (JANUVIA) 100 MG tablet  cloNIDine (CATAPRES) 0.2 MG tablet   Have you contacted your pharmacy to request a refill? No new rx   Which pharmacy would you like this sent to? [facility]   Patient notified that their request is being sent to the clinical staff for review and that they should receive a response within 2 business days.

## 2022-09-12 ENCOUNTER — Other Ambulatory Visit (HOSPITAL_COMMUNITY): Payer: Self-pay

## 2022-09-25 ENCOUNTER — Ambulatory Visit: Payer: 59 | Admitting: Family Medicine

## 2022-09-26 ENCOUNTER — Ambulatory Visit: Payer: 59 | Admitting: Family Medicine

## 2022-09-29 ENCOUNTER — Encounter: Payer: Self-pay | Admitting: Family Medicine

## 2022-10-10 ENCOUNTER — Other Ambulatory Visit (HOSPITAL_BASED_OUTPATIENT_CLINIC_OR_DEPARTMENT_OTHER): Payer: Self-pay

## 2022-10-10 ENCOUNTER — Other Ambulatory Visit: Payer: Self-pay

## 2022-10-10 ENCOUNTER — Other Ambulatory Visit: Payer: Self-pay | Admitting: Family Medicine

## 2022-10-10 ENCOUNTER — Other Ambulatory Visit (HOSPITAL_COMMUNITY): Payer: Self-pay

## 2022-10-10 ENCOUNTER — Telehealth: Payer: Self-pay | Admitting: Family Medicine

## 2022-10-10 DIAGNOSIS — I10 Essential (primary) hypertension: Secondary | ICD-10-CM

## 2022-10-10 DIAGNOSIS — E1169 Type 2 diabetes mellitus with other specified complication: Secondary | ICD-10-CM

## 2022-10-10 MED ORDER — TERAZOSIN HCL 5 MG PO CAPS
5.0000 mg | ORAL_CAPSULE | Freq: Every day | ORAL | 0 refills | Status: DC
Start: 2022-10-10 — End: 2022-11-23
  Filled 2022-10-10: qty 60, 60d supply, fill #0

## 2022-10-10 MED ORDER — CLONIDINE HCL 0.2 MG PO TABS
0.2000 mg | ORAL_TABLET | Freq: Two times a day (BID) | ORAL | 0 refills | Status: DC
  Filled 2022-10-10: qty 120, 60d supply, fill #0

## 2022-10-10 MED ORDER — SITAGLIPTIN PHOSPHATE 100 MG PO TABS
100.0000 mg | ORAL_TABLET | Freq: Every day | ORAL | 0 refills | Status: DC
Start: 2022-10-10 — End: 2022-11-23
  Filled 2022-10-10: qty 30, 30d supply, fill #0
  Filled 2022-11-09: qty 30, 30d supply, fill #1

## 2022-10-10 MED ORDER — CARVEDILOL 25 MG PO TABS
25.0000 mg | ORAL_TABLET | Freq: Two times a day (BID) | ORAL | 3 refills | Status: DC
Start: 2022-10-10 — End: 2023-12-08
  Filled 2022-10-10: qty 180, 90d supply, fill #0
  Filled 2023-01-11: qty 180, 90d supply, fill #1
  Filled 2023-04-12: qty 180, 90d supply, fill #2
  Filled 2023-06-25 – 2023-06-29 (×2): qty 180, 90d supply, fill #3

## 2022-10-10 NOTE — Telephone Encounter (Signed)
  Prescription Request  10/10/2022  Is this a "Controlled Substance" medicine?   Have you seen your PCP in the last 2 weeks? Had appt on 9/9 and 9/10 but had to cancel due to work  NOV 11/6 (first available with PCP)   If YES, route message to pool  -  If NO, patient needs to be scheduled for appointment.  What is the name of the medication or equipment? cloNIDine (CATAPRES) 0.2 MG tablet  Have you contacted your pharmacy to request a refill? Yes, has to be seen before next refill - pt aware    Which pharmacy would you like this sent to?  [place] [facility] (Ph: [phone])   *Made patient aware that PCP is on vacation   Patient notified that their request is being sent to the clinical staff for review and that they should receive a response within 2 business days.

## 2022-10-10 NOTE — Telephone Encounter (Signed)
Pt has an appt scheduled in Nov to see St[facility]. 14m supply of Clonidine sent.

## 2022-11-09 ENCOUNTER — Other Ambulatory Visit (HOSPITAL_COMMUNITY): Payer: Self-pay

## 2022-11-09 ENCOUNTER — Other Ambulatory Visit: Payer: Self-pay | Admitting: Family Medicine

## 2022-11-10 ENCOUNTER — Other Ambulatory Visit (HOSPITAL_COMMUNITY): Payer: Self-pay

## 2022-11-10 MED ORDER — CLONIDINE HCL 0.2 MG PO TABS
0.2000 mg | ORAL_TABLET | Freq: Two times a day (BID) | ORAL | 0 refills | Status: DC
  Filled 2022-11-10 – 2022-11-13 (×2): qty 120, 60d supply, fill #0

## 2022-11-13 ENCOUNTER — Other Ambulatory Visit (HOSPITAL_COMMUNITY): Payer: Self-pay

## 2022-11-22 ENCOUNTER — Telehealth: Payer: Self-pay | Admitting: Family Medicine

## 2022-11-22 ENCOUNTER — Ambulatory Visit: Payer: 59 | Admitting: Family Medicine

## 2022-11-23 ENCOUNTER — Other Ambulatory Visit (HOSPITAL_COMMUNITY): Payer: Self-pay

## 2022-11-23 ENCOUNTER — Encounter: Payer: Self-pay | Admitting: Family Medicine

## 2022-11-23 ENCOUNTER — Ambulatory Visit: Payer: 59 | Admitting: Family Medicine

## 2022-11-23 ENCOUNTER — Other Ambulatory Visit: Payer: Self-pay

## 2022-11-23 VITALS — BP 178/80 | HR 65 | Temp 97.8°F | Ht 69.0 in | Wt 208.2 lb

## 2022-11-23 DIAGNOSIS — E1169 Type 2 diabetes mellitus with other specified complication: Secondary | ICD-10-CM | POA: Diagnosis not present

## 2022-11-23 DIAGNOSIS — E785 Hyperlipidemia, unspecified: Secondary | ICD-10-CM

## 2022-11-23 DIAGNOSIS — Z7984 Long term (current) use of oral hypoglycemic drugs: Secondary | ICD-10-CM

## 2022-11-23 DIAGNOSIS — I10 Essential (primary) hypertension: Secondary | ICD-10-CM

## 2022-11-23 LAB — BAYER DCA HB A1C WAIVED: HB A1C (BAYER DCA - WAIVED): 6.6 % — ABNORMAL HIGH (ref 4.8–5.6)

## 2022-11-23 MED ORDER — METFORMIN HCL ER 750 MG PO TB24
750.0000 mg | ORAL_TABLET | Freq: Two times a day (BID) | ORAL | 3 refills | Status: DC
  Filled 2022-11-23: qty 180, 90d supply, fill #0
  Filled 2023-02-09: qty 180, 90d supply, fill #1
  Filled 2023-05-11: qty 180, 90d supply, fill #2
  Filled 2023-08-07: qty 180, 90d supply, fill #3

## 2022-11-23 MED ORDER — ATORVASTATIN CALCIUM 20 MG PO TABS
20.0000 mg | ORAL_TABLET | Freq: Every day | ORAL | 3 refills | Status: DC
Start: 2022-11-23 — End: 2023-11-06
  Filled 2022-11-23 – 2022-12-13 (×2): qty 90, 90d supply, fill #0
  Filled 2023-03-15: qty 90, 90d supply, fill #1
  Filled 2023-06-09: qty 90, 90d supply, fill #2
  Filled 2023-09-03: qty 90, 90d supply, fill #3

## 2022-11-23 MED ORDER — SPIRONOLACTONE 25 MG PO TABS
25.0000 mg | ORAL_TABLET | Freq: Every day | ORAL | 3 refills | Status: DC
  Filled 2022-11-23 – 2023-01-11 (×2): qty 90, 90d supply, fill #0
  Filled 2023-04-12: qty 90, 90d supply, fill #1
  Filled 2023-06-25 – 2023-06-29 (×2): qty 90, 90d supply, fill #2
  Filled 2023-10-12: qty 90, 90d supply, fill #3

## 2022-11-23 MED ORDER — TERAZOSIN HCL 5 MG PO CAPS
5.0000 mg | ORAL_CAPSULE | Freq: Every day | ORAL | 3 refills | Status: DC
  Filled 2022-11-23 – 2022-12-13 (×2): qty 90, 90d supply, fill #0
  Filled 2023-03-15: qty 90, 90d supply, fill #1
  Filled 2023-06-25: qty 90, 90d supply, fill #2
  Filled 2023-09-14: qty 90, 90d supply, fill #3

## 2022-11-23 MED ORDER — CLONIDINE HCL 0.3 MG PO TABS
0.3000 mg | ORAL_TABLET | Freq: Two times a day (BID) | ORAL | 3 refills | Status: DC
  Filled 2022-11-23: qty 180, 90d supply, fill #0
  Filled 2023-02-09: qty 180, 90d supply, fill #1
  Filled 2023-05-11: qty 180, 90d supply, fill #2
  Filled 2023-08-07: qty 180, 90d supply, fill #3

## 2022-11-23 MED ORDER — SITAGLIPTIN PHOSPHATE 100 MG PO TABS
100.0000 mg | ORAL_TABLET | Freq: Every day | ORAL | 3 refills | Status: DC
  Filled 2022-11-23: qty 90, 90d supply, fill #0
  Filled 2022-12-13: qty 30, 30d supply, fill #0
  Filled 2023-01-11: qty 30, 30d supply, fill #1
  Filled 2023-02-09 – 2023-02-12 (×3): qty 30, 30d supply, fill #2
  Filled 2023-03-15: qty 30, 30d supply, fill #3
  Filled 2023-04-12: qty 30, 30d supply, fill #4
  Filled 2023-05-11: qty 30, 30d supply, fill #5
  Filled 2023-06-09: qty 30, 30d supply, fill #6
  Filled 2023-06-25 – 2023-07-09 (×2): qty 30, 30d supply, fill #7
  Filled 2023-08-07: qty 30, 30d supply, fill #8
  Filled 2023-09-03: qty 30, 30d supply, fill #9
  Filled 2023-10-12: qty 30, 30d supply, fill #10
  Filled 2023-11-06: qty 30, 30d supply, fill #11

## 2022-11-23 NOTE — Progress Notes (Signed)
Subjective:  Patient ID: Jim Sullivan,  male    DOB: [birth year]  Age: 67 y.o.    CC: Medical Management of Chronic Issues   HPI Jim Sullivan presents for  follow-up of hypertension. Patient has no history of headache chest pain or shortness of breath or recent cough. Patient also denies symptoms of TIA such as numbness weakness lateralizing. Patient denies side effects from medication. States taking it regularly.  Patient also  in for follow-up of elevated cholesterol. Doing well without complaints on current medication. Denies side effects  including myalgia and arthralgia and nausea. Also in today for liver function testing. Currently no chest pain, shortness of breath or other cardiovascular related symptoms noted.  Follow-up of diabetes. Patient does check blood sugar at home. Off and on for months. -Ran out of strips.  Patient denies symptoms such as excessive hunger or urinary frequency, excessive hunger, nausea No significant hypoglycemic spells noted. Medications reviewed. Pt reports taking them regularly. Pt. denies complication/adverse reaction today.   Lorazepam makes it too hard to function.    History Jim Sullivan has a past medical history of Diabetes mellitus without complication (HCC), Erectile dysfunction, GERD (gastroesophageal reflux disease), Hyperlipidemia, and Hypertension.   He has a past surgical history that includes Hernia repair (Right, 2019); Hernia repair (Left, infancy); Small intestine surgery (2013); Trigger finger release (Left); hand surgery (Left); Colonoscopy with propofol (N/A, 10/10/2021); and polypectomy (10/10/2021).   His family history is not on file.He reports that he quit smoking about 9 years ago. His smoking use included cigarettes. He started smoking about 39 years ago. He has a 15 pack-year smoking history. He has never used smokeless tobacco. He reports current alcohol use. He reports that he does not use drugs.  Current Outpatient Medications  on File Prior to Visit  Medication Sig Dispense Refill   carvedilol (COREG) 25 MG tablet Take 1 tablet (25 mg total) by mouth 2 (two) times daily with a meal. 180 tablet 3   econazole nitrate 1 % cream Apply topically 2 (two) times daily. To affected areas 30 g 1   glucose blood (FREESTYLE LITE) test strip Use as instructed 100 each 12   Multiple Vitamin (MULTIVITAMIN WITH MINERALS) TABS tablet Take 1 tablet by mouth daily.     Vitamin D, Ergocalciferol, (DRISDOL) 1.25 MG (50000 UNIT) CAPS capsule Take 1 capsule (50,000 Units total) by mouth every 7 (seven) days. 12 capsule 0   No current facility-administered medications on file prior to visit.    ROS Review of Systems  Constitutional:  Negative for fever.  Respiratory:  Negative for shortness of breath.   Cardiovascular:  Negative for chest pain.  Musculoskeletal:  Negative for arthralgias.  Skin:  Negative for rash.    Objective:  BP (!) 178/80   Pulse 65   Temp 97.8 F (36.6 C)   Ht 5\' 9"  (1.753 m)   Wt 208 lb 3.2 oz (94.4 kg)   SpO2 99%   BMI 30.75 kg/m   BP Readings from Last 3 Encounters:  11/23/22 (!) 178/80  06/27/22 (!) 226/92  06/21/22 (!) 137/57    Wt Readings from Last 3 Encounters:  11/23/22 208 lb 3.2 oz (94.4 kg)  06/27/22 209 lb 3.2 oz (94.9 kg)  06/21/22 215 lb (97.5 kg)     Physical Exam Vitals reviewed.  Constitutional:      Appearance: He is well-developed.  HENT:     Head: Normocephalic and atraumatic.     Right Ear: External ear  normal.     Left Ear: External ear normal.     Mouth/Throat:     Pharynx: No oropharyngeal exudate or posterior oropharyngeal erythema.  Eyes:     Pupils: Pupils are equal, round, and reactive to light.  Cardiovascular:     Rate and Rhythm: Normal rate and regular rhythm.     Heart sounds: No murmur heard. Pulmonary:     Effort: No respiratory distress.     Breath sounds: Normal breath sounds.  Musculoskeletal:     Cervical back: Normal range of motion and  neck supple.  Neurological:     Mental Status: He is alert and oriented to person, place, and time.     Diabetic Foot Exam - Simple   No data filed     Lab Results  Component Value Date   HGBA1C 6.6 (H) 11/23/2022   HGBA1C 6.8 (H) 02/09/2022   HGBA1C 6.7 (H) 09/28/2021    Assessment & Plan:   Jim Sullivan was seen today for medical management of chronic issues.  Diagnoses and all orders for this visit:  Type 2 diabetes mellitus with other specified complication, without long-term current use of insulin (HCC) -     Bayer DCA Hb A1c Waived -     metFORMIN (GLUCOPHAGE-XR) 750 MG 24 hr tablet; Take 1 tablet (750 mg total) by mouth 2 (two) times daily. -     sitaGLIPtin (JANUVIA) 100 MG tablet; Take 1 tablet (100 mg total) by mouth daily.  Essential hypertension -     CBC with Differential/Platelet -     CMP14+EGFR -     spironolactone (ALDACTONE) 25 MG tablet; Take 1 tablet (25 mg total) by mouth daily. -     terazosin (HYTRIN) 5 MG capsule; Take 1 capsule (5 mg total) by mouth at bedtime.  Hyperlipidemia, unspecified hyperlipidemia type -     Lipid panel -     atorvastatin (LIPITOR) 20 MG tablet; Take 1 tablet (20 mg total) by mouth daily.  Other orders -     cloNIDine (CATAPRES) 0.3 MG tablet; Take 1 tablet (0.3 mg total) by mouth 2 (two) times daily.   I have discontinued Jim Sullivan's LORazepam. I have also changed his cloNIDine, metFORMIN, sitaGLIPtin, and terazosin. Additionally, I am having him maintain his econazole nitrate, FREESTYLE LITE, multivitamin with minerals, Vitamin D (Ergocalciferol), carvedilol, atorvastatin, and spironolactone.  Meds ordered this encounter  Medications   atorvastatin (LIPITOR) 20 MG tablet    Sig: Take 1 tablet (20 mg total) by mouth daily.    Dispense:  90 tablet    Refill:  3   cloNIDine (CATAPRES) 0.3 MG tablet    Sig: Take 1 tablet (0.3 mg total) by mouth 2 (two) times daily.    Dispense:  180 tablet    Refill:  3   metFORMIN  (GLUCOPHAGE-XR) 750 MG 24 hr tablet    Sig: Take 1 tablet (750 mg total) by mouth 2 (two) times daily.    Dispense:  180 tablet    Refill:  3   sitaGLIPtin (JANUVIA) 100 MG tablet    Sig: Take 1 tablet (100 mg total) by mouth daily.    Dispense:  90 tablet    Refill:  3   spironolactone (ALDACTONE) 25 MG tablet    Sig: Take 1 tablet (25 mg total) by mouth daily.    Dispense:  90 tablet    Refill:  3   terazosin (HYTRIN) 5 MG capsule    Sig: Take 1 capsule (  5 mg total) by mouth at bedtime.    Dispense:  180 capsule    Refill:  3     Follow-up: Return in about 3 months (around 02/23/2023).  Mechele Claude, M.D.

## 2022-11-24 LAB — CBC WITH DIFFERENTIAL/PLATELET
Basophils Absolute: 0.1 10*3/uL (ref 0.0–0.2)
Basos: 1 %
EOS (ABSOLUTE): 0.3 10*3/uL (ref 0.0–0.4)
Eos: 3 %
Hematocrit: 35 % — ABNORMAL LOW (ref 37.5–51.0)
Hemoglobin: 11.3 g/dL — ABNORMAL LOW (ref 13.0–17.7)
Immature Grans (Abs): 0 10*3/uL (ref 0.0–0.1)
Immature Granulocytes: 0 %
Lymphocytes Absolute: 1.5 10*3/uL (ref 0.7–3.1)
Lymphs: 19 %
MCH: 30.1 pg (ref 26.6–33.0)
MCHC: 32.3 g/dL (ref 31.5–35.7)
MCV: 93 fL (ref 79–97)
Monocytes Absolute: 0.6 10*3/uL (ref 0.1–0.9)
Monocytes: 8 %
Neutrophils Absolute: 5.4 10*3/uL (ref 1.4–7.0)
Neutrophils: 69 %
Platelets: 416 10*3/uL (ref 150–450)
RBC: 3.76 x10E6/uL — ABNORMAL LOW (ref 4.14–5.80)
RDW: 12.1 % (ref 11.6–15.4)
WBC: 7.9 10*3/uL (ref 3.4–10.8)

## 2022-11-24 LAB — CMP14+EGFR
ALT: 14 [IU]/L (ref 0–44)
AST: 22 [IU]/L (ref 0–40)
Albumin: 4.4 g/dL (ref 3.9–4.9)
Alkaline Phosphatase: 116 [IU]/L (ref 44–121)
BUN/Creatinine Ratio: 13 (ref 10–24)
BUN: 26 mg/dL (ref 8–27)
Bilirubin Total: 0.3 mg/dL (ref 0.0–1.2)
CO2: 23 mmol/L (ref 20–29)
Calcium: 10.1 mg/dL (ref 8.6–10.2)
Chloride: 101 mmol/L (ref 96–106)
Creatinine, Ser: 1.99 mg/dL — ABNORMAL HIGH (ref 0.76–1.27)
Globulin, Total: 2.9 g/dL (ref 1.5–4.5)
Glucose: 165 mg/dL — ABNORMAL HIGH (ref 70–99)
Potassium: 5.5 mmol/L — ABNORMAL HIGH (ref 3.5–5.2)
Sodium: 136 mmol/L (ref 134–144)
Total Protein: 7.3 g/dL (ref 6.0–8.5)
eGFR: 36 mL/min/{1.73_m2} — ABNORMAL LOW (ref >59)

## 2022-11-24 LAB — LIPID PANEL
Chol/HDL Ratio: 3.9 ratio (ref 0.0–5.0)
Cholesterol, Total: 126 mg/dL (ref 100–199)
HDL: 32 mg/dL — ABNORMAL LOW (ref >39)
LDL Chol Calc (NIH): 68 mg/dL (ref 0–99)
Triglycerides: 147 mg/dL (ref 0–149)
VLDL Cholesterol Cal: 26 mg/dL (ref 5–40)

## 2022-11-26 ENCOUNTER — Encounter: Payer: Self-pay | Admitting: Family Medicine

## 2022-11-26 NOTE — Progress Notes (Signed)
Hello Jabron,  Your lab result is normal and/or stable.Some minor variations that are not significant are commonly marked abnormal, but do not represent any medical problem for you.  Best regards, Emunah Texidor, M.D.

## 2022-12-13 ENCOUNTER — Other Ambulatory Visit (HOSPITAL_COMMUNITY): Payer: Self-pay

## 2022-12-13 ENCOUNTER — Other Ambulatory Visit: Payer: Self-pay

## 2022-12-13 ENCOUNTER — Other Ambulatory Visit: Payer: Self-pay | Admitting: Family Medicine

## 2022-12-13 DIAGNOSIS — E559 Vitamin D deficiency, unspecified: Secondary | ICD-10-CM

## 2022-12-17 MED ORDER — VITAMIN D (ERGOCALCIFEROL) 1.25 MG (50000 UNIT) PO CAPS
50000.0000 [IU] | ORAL_CAPSULE | ORAL | 0 refills | Status: DC
  Filled 2022-12-17: qty 12, 84d supply, fill #0

## 2022-12-18 ENCOUNTER — Other Ambulatory Visit: Payer: Self-pay

## 2022-12-18 ENCOUNTER — Other Ambulatory Visit (HOSPITAL_COMMUNITY): Payer: Self-pay

## 2022-12-26 DIAGNOSIS — H52203 Unspecified astigmatism, bilateral: Secondary | ICD-10-CM | POA: Diagnosis not present

## 2022-12-26 DIAGNOSIS — H5213 Myopia, bilateral: Secondary | ICD-10-CM | POA: Diagnosis not present

## 2022-12-26 LAB — HM DIABETES EYE EXAM

## 2023-01-11 ENCOUNTER — Other Ambulatory Visit (HOSPITAL_COMMUNITY): Payer: Self-pay

## 2023-02-09 ENCOUNTER — Other Ambulatory Visit (HOSPITAL_COMMUNITY): Payer: Self-pay

## 2023-02-12 ENCOUNTER — Other Ambulatory Visit (HOSPITAL_COMMUNITY): Payer: Self-pay

## 2023-03-15 ENCOUNTER — Other Ambulatory Visit: Payer: Self-pay | Admitting: Family Medicine

## 2023-03-15 ENCOUNTER — Other Ambulatory Visit: Payer: Self-pay

## 2023-03-15 ENCOUNTER — Other Ambulatory Visit (HOSPITAL_COMMUNITY): Payer: Self-pay

## 2023-03-15 DIAGNOSIS — E559 Vitamin D deficiency, unspecified: Secondary | ICD-10-CM

## 2023-03-15 MED ORDER — VITAMIN D (ERGOCALCIFEROL) 1.25 MG (50000 UNIT) PO CAPS
50000.0000 [IU] | ORAL_CAPSULE | ORAL | 0 refills | Status: DC
  Filled 2023-03-15: qty 12, 84d supply, fill #0

## 2023-04-12 ENCOUNTER — Other Ambulatory Visit (HOSPITAL_COMMUNITY): Payer: Self-pay

## 2023-05-11 ENCOUNTER — Other Ambulatory Visit (HOSPITAL_COMMUNITY): Payer: Self-pay

## 2023-05-14 ENCOUNTER — Encounter: Payer: Self-pay | Admitting: Family Medicine

## 2023-05-14 ENCOUNTER — Other Ambulatory Visit (HOSPITAL_COMMUNITY): Payer: Self-pay

## 2023-05-14 ENCOUNTER — Ambulatory Visit (INDEPENDENT_AMBULATORY_CARE_PROVIDER_SITE_OTHER): Admitting: Family Medicine

## 2023-05-14 ENCOUNTER — Other Ambulatory Visit: Payer: Self-pay

## 2023-05-14 VITALS — BP 105/58 | HR 66 | Temp 98.2°F | Ht 69.0 in | Wt 205.0 lb

## 2023-05-14 DIAGNOSIS — I10 Essential (primary) hypertension: Secondary | ICD-10-CM | POA: Diagnosis not present

## 2023-05-14 DIAGNOSIS — N179 Acute kidney failure, unspecified: Secondary | ICD-10-CM | POA: Diagnosis not present

## 2023-05-14 DIAGNOSIS — Z23 Encounter for immunization: Secondary | ICD-10-CM | POA: Diagnosis not present

## 2023-05-14 DIAGNOSIS — E1169 Type 2 diabetes mellitus with other specified complication: Secondary | ICD-10-CM

## 2023-05-14 DIAGNOSIS — E785 Hyperlipidemia, unspecified: Secondary | ICD-10-CM | POA: Diagnosis not present

## 2023-05-14 DIAGNOSIS — N1831 Chronic kidney disease, stage 3a: Secondary | ICD-10-CM | POA: Diagnosis not present

## 2023-05-14 LAB — BAYER DCA HB A1C WAIVED: HB A1C (BAYER DCA - WAIVED): 6.5 % — ABNORMAL HIGH (ref 4.8–5.6)

## 2023-05-14 MED ORDER — FREESTYLE LITE TEST VI STRP
ORAL_STRIP | 12 refills | Status: AC
  Filled 2023-05-14: qty 100, 90d supply, fill #0
  Filled 2024-01-24: qty 100, 25d supply, fill #0

## 2023-05-14 MED ORDER — ECONAZOLE NITRATE 1 % EX CREA
TOPICAL_CREAM | Freq: Two times a day (BID) | CUTANEOUS | 1 refills | Status: AC
  Filled 2023-05-14 – 2023-05-22 (×2): qty 30, 30d supply, fill #0
  Filled 2023-06-26: qty 30, 30d supply, fill #1
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
## 2023-05-15 ENCOUNTER — Encounter: Payer: Self-pay | Admitting: Pharmacist

## 2023-05-15 ENCOUNTER — Other Ambulatory Visit: Payer: Self-pay

## 2023-05-15 ENCOUNTER — Other Ambulatory Visit (HOSPITAL_COMMUNITY): Payer: Self-pay

## 2023-05-15 LAB — CBC WITH DIFFERENTIAL/PLATELET
Basophils Absolute: 0.1 10*3/uL (ref 0.0–0.2)
Basos: 1 %
EOS (ABSOLUTE): 0.1 10*3/uL (ref 0.0–0.4)
Eos: 1 %
Hematocrit: 33 % — ABNORMAL LOW (ref 37.5–51.0)
Hemoglobin: 10.9 g/dL — ABNORMAL LOW (ref 13.0–17.7)
Immature Grans (Abs): 0 10*3/uL (ref 0.0–0.1)
Immature Granulocytes: 0 %
Lymphocytes Absolute: 1.1 10*3/uL (ref 0.7–3.1)
Lymphs: 10 %
MCH: 31 pg (ref 26.6–33.0)
MCHC: 33 g/dL (ref 31.5–35.7)
MCV: 94 fL (ref 79–97)
Monocytes Absolute: 0.7 10*3/uL (ref 0.1–0.9)
Monocytes: 6 %
Neutrophils Absolute: 9.3 10*3/uL — ABNORMAL HIGH (ref 1.4–7.0)
Neutrophils: 82 %
Platelets: 413 10*3/uL (ref 150–450)
RBC: 3.52 x10E6/uL — ABNORMAL LOW (ref 4.14–5.80)
RDW: 12.3 % (ref 11.6–15.4)
WBC: 11.3 10*3/uL — ABNORMAL HIGH (ref 3.4–10.8)

## 2023-05-15 LAB — CMP14+EGFR
ALT: 14 IU/L (ref 0–44)
AST: 19 IU/L (ref 0–40)
Albumin: 4.3 g/dL (ref 3.9–4.9)
Alkaline Phosphatase: 119 IU/L (ref 44–121)
BUN/Creatinine Ratio: 12 (ref 10–24)
BUN: 25 mg/dL (ref 8–27)
Bilirubin Total: 0.4 mg/dL (ref 0.0–1.2)
CO2: 20 mmol/L (ref 20–29)
Calcium: 9.8 mg/dL (ref 8.6–10.2)
Chloride: 101 mmol/L (ref 96–106)
Creatinine, Ser: 2.09 mg/dL — ABNORMAL HIGH (ref 0.76–1.27)
Globulin, Total: 2.8 g/dL (ref 1.5–4.5)
Glucose: 148 mg/dL — ABNORMAL HIGH (ref 70–99)
Potassium: 6.2 mmol/L (ref 3.5–5.2)
Sodium: 135 mmol/L (ref 134–144)
Total Protein: 7.1 g/dL (ref 6.0–8.5)
eGFR: 34 mL/min/{1.73_m2} — ABNORMAL LOW (ref >59)

## 2023-05-15 LAB — LIPID PANEL
Chol/HDL Ratio: 4.9 ratio (ref 0.0–5.0)
Cholesterol, Total: 137 mg/dL (ref 100–199)
HDL: 28 mg/dL — ABNORMAL LOW (ref >39)
LDL Chol Calc (NIH): 80 mg/dL (ref 0–99)
Triglycerides: 169 mg/dL — ABNORMAL HIGH (ref 0–149)
VLDL Cholesterol Cal: 29 mg/dL (ref 5–40)

## 2023-05-17 ENCOUNTER — Other Ambulatory Visit: Payer: Self-pay | Admitting: Family Medicine

## 2023-05-17 MED ORDER — LOKELMA 10 G PO PACK
10.0000 g | PACK | Freq: Every day | ORAL | 0 refills | Status: DC
  Filled 2023-05-17: qty 7, 7d supply, fill #0

## 2023-05-18 ENCOUNTER — Other Ambulatory Visit (HOSPITAL_COMMUNITY): Payer: Self-pay

## 2023-05-18 ENCOUNTER — Other Ambulatory Visit: Payer: Self-pay

## 2023-05-22 ENCOUNTER — Other Ambulatory Visit (HOSPITAL_COMMUNITY): Payer: Self-pay

## 2023-06-09 ENCOUNTER — Other Ambulatory Visit (HOSPITAL_COMMUNITY): Payer: Self-pay

## 2023-06-25 ENCOUNTER — Other Ambulatory Visit (HOSPITAL_COMMUNITY): Payer: Self-pay

## 2023-06-25 ENCOUNTER — Other Ambulatory Visit: Payer: Self-pay | Admitting: Family Medicine

## 2023-06-25 DIAGNOSIS — E559 Vitamin D deficiency, unspecified: Secondary | ICD-10-CM

## 2023-06-25 MED ORDER — VITAMIN D (ERGOCALCIFEROL) 1.25 MG (50000 UNIT) PO CAPS
50000.0000 [IU] | ORAL_CAPSULE | ORAL | 1 refills | Status: DC
  Filled 2023-06-25: qty 12, 84d supply, fill #0
  Filled 2023-09-14: qty 12, 84d supply, fill #1

## 2023-06-26 ENCOUNTER — Other Ambulatory Visit (HOSPITAL_COMMUNITY): Payer: Self-pay

## 2023-06-27 ENCOUNTER — Other Ambulatory Visit: Payer: Self-pay

## 2023-06-27 ENCOUNTER — Other Ambulatory Visit (HOSPITAL_COMMUNITY): Payer: Self-pay

## 2023-07-09 ENCOUNTER — Other Ambulatory Visit (HOSPITAL_COMMUNITY): Payer: Self-pay

## 2023-07-18 ENCOUNTER — Other Ambulatory Visit (HOSPITAL_COMMUNITY): Payer: Self-pay

## 2023-08-07 ENCOUNTER — Other Ambulatory Visit (HOSPITAL_COMMUNITY): Payer: Self-pay

## 2023-09-03 ENCOUNTER — Encounter: Payer: Self-pay | Admitting: Family Medicine

## 2023-09-03 ENCOUNTER — Ambulatory Visit: Admitting: Family Medicine

## 2023-09-03 ENCOUNTER — Ambulatory Visit: Payer: Self-pay | Admitting: Family Medicine

## 2023-09-03 ENCOUNTER — Other Ambulatory Visit (HOSPITAL_COMMUNITY): Payer: Self-pay

## 2023-09-03 VITALS — BP 90/55 | HR 62 | Temp 96.9°F | Ht 69.0 in | Wt 198.6 lb

## 2023-09-03 DIAGNOSIS — E785 Hyperlipidemia, unspecified: Secondary | ICD-10-CM

## 2023-09-03 DIAGNOSIS — I10 Essential (primary) hypertension: Secondary | ICD-10-CM

## 2023-09-03 DIAGNOSIS — E1169 Type 2 diabetes mellitus with other specified complication: Secondary | ICD-10-CM | POA: Diagnosis not present

## 2023-09-03 LAB — BAYER DCA HB A1C WAIVED: HB A1C (BAYER DCA - WAIVED): 6.2 % — ABNORMAL HIGH (ref 4.8–5.6)

## 2023-09-03 MED ORDER — CLONIDINE HCL 0.2 MG PO TABS
0.2000 mg | ORAL_TABLET | Freq: Two times a day (BID) | ORAL | 3 refills | Status: AC
  Filled 2023-09-03: qty 180, 90d supply, fill #0
  Filled 2023-12-06: qty 180, 90d supply, fill #1

## 2023-09-03 NOTE — Progress Notes (Signed)
 Subjective:  Patient ID: Jim Sullivan,  male    DOB: 13-Sep-1955  Age: 68 y.o.    CC: 3 MONTH FOLLOW UP   HPI Jim Sullivan presents for  follow-up of hypertension. Patient has no history of headache chest pain or shortness of breath or recent cough. Patient also denies symptoms of TIA such as numbness weakness lateralizing. Patient denies side effects from medication. States taking it regularly.  Patient also  in for follow-up of elevated cholesterol. Doing well without complaints on current medication. Denies side effects  including myalgia and arthralgia and nausea. Also in today for liver function testing. Currently no chest pain, shortness of breath or other cardiovascular related symptoms noted.  Follow-up of diabetes. Patient does check blood sugar at home. Readings run between 100 and 150 fasting and post prandial Patient denies symptoms such as excessive hunger or urinary frequency, excessive hunger, nausea No significant hypoglycemic spells noted. Medications reviewed. Pt reports taking them regularly. Pt. denies complication/adverse reaction today.   Discussed the use of AI scribe software for clinical note transcription with the patient, who gave verbal consent to proceed.  History of Present Illness   Jim Sullivan is a 68 year old male with hypertension and diabetes who presents for a regular diabetes checkup.  He has been experiencing low blood pressure readings and dizziness. His blood pressure was 110/68 mmHg in the morning before taking his medication and 90/60 mmHg when measured at the clinic. He describes an episode where he collapsed after getting out of bed in the middle of the night and another instance where he felt dizzy while standing at the sink. He attributes these symptoms to a recent change in his blood pressure medication, specifically clonidine , which he takes at a dose of 0.3 mg twice daily.  He mentions a previous issue with high potassium levels, for  which he was prescribed Lokelma . However, he experienced vomiting approximately 30 minutes after ingestion on two separate occasions, leading him to discontinue its use. His potassium level was previously recorded at 6.2 mmol/L, with prior levels running in the fives.  He has lost seven pounds recently, attributing this to dietary changes such as avoiding junk food, eating at the right times, and reducing snacks. He describes a typical meal plan that includes lunch at work or home and an early dinner. He mentions a recent meal of barbecue chicken, greens, cornbread, and baked beans.  Regarding his diabetes management, his blood sugar readings prior to his glucometer malfunctioning four weeks ago were between 130-150 mg/dL, with occasional readings over 150 mg/dL. These readings were taken both fasting in the morning and after meals in the evening. His A1c is currently 6.2%.  No swelling, heartburn, or indigestion. Reports no swelling in ankles and no abdominal issues.      History [name] has a past medical history of Diabetes mellitus without complication (HCC), Erectile dysfunction, GERD (gastroesophageal reflux disease), Hyperlipidemia, and Hypertension.   He has a past surgical history that includes Hernia repair (Right, 2019); Hernia repair (Left, infancy); Small intestine surgery (2013); Trigger finger release (Left); hand surgery (Left); Colonoscopy with propofol  (N/A, 10/10/2021); and polypectomy (10/10/2021).   His family history is not on file.He reports that he quit smoking about 10 years ago. His smoking use included cigarettes. He started smoking about 40 years ago. He has a 15 pack-year smoking history. He has never used smokeless tobacco. He reports current alcohol use. He reports that he does not use drugs.  Current Outpatient Medications  on File Prior to Visit  Medication Sig Dispense Refill   atorvastatin  (LIPITOR) 20 MG tablet Take 1 tablet (20 mg total) by mouth daily. 90 tablet 3    carvedilol  (COREG ) 25 MG tablet Take 1 tablet (25 mg total) by mouth 2 (two) times daily with a meal. 180 tablet 3   econazole nitrate  1 % cream Apply topically 2 (two) times daily. To affected areas 30 g 1   glucose blood (FREESTYLE LITE) test strip Use as instructed 100 each 12   metFORMIN  (GLUCOPHAGE -XR) 750 MG 24 hr tablet Take 1 tablet (750 mg total) by mouth 2 (two) times daily. 180 tablet 3   Multiple Vitamin (MULTIVITAMIN WITH MINERALS) TABS tablet Take 1 tablet by mouth daily.     sitaGLIPtin  (JANUVIA ) 100 MG tablet Take 1 tablet (100 mg total) by mouth daily. 90 tablet 3   spironolactone  (ALDACTONE ) 25 MG tablet Take 1 tablet (25 mg total) by mouth daily. 90 tablet 3   terazosin  (HYTRIN ) 5 MG capsule Take 1 capsule (5 mg total) by mouth at bedtime. 180 capsule 3   Vitamin D , Ergocalciferol , (DRISDOL ) 1.25 MG (50000 UNIT) CAPS capsule Take 1 capsule (50,000 Units total) by mouth every 7 (seven) days. 12 capsule 1   No current facility-administered medications on file prior to visit.    ROS Review of Systems  Constitutional:  Negative for fever.  Respiratory:  Negative for shortness of breath.   Cardiovascular:  Negative for chest pain.  Musculoskeletal:  Negative for arthralgias.  Skin:  Negative for rash.    Objective:  BP (!) 90/55   Pulse 62   Temp (!) 96.9 F (36.1 C)   Ht 5' 9 (1.753 m)   Wt 198 lb 9.6 oz (90.1 kg)   SpO2 99%   BMI 29.33 kg/m   BP Readings from Last 3 Encounters:  09/03/23 (!) 90/55  05/14/23 (!) 105/58  11/23/22 (!) 178/80    Wt Readings from Last 3 Encounters:  09/03/23 198 lb 9.6 oz (90.1 kg)  05/14/23 205 lb (93 kg)  11/23/22 208 lb 3.2 oz (94.4 kg)    Lab Results  Component Value Date   HGBA1C 6.2 (H) 09/03/2023   HGBA1C 6.5 (H) 05/14/2023   HGBA1C 6.6 (H) 11/23/2022    Physical Exam Vitals reviewed.  Constitutional:      Appearance: He is well-developed.  HENT:     Head: Normocephalic and atraumatic.     Right Ear:  External ear normal.     Left Ear: External ear normal.     Mouth/Throat:     Pharynx: No oropharyngeal exudate or posterior oropharyngeal erythema.  Eyes:     Pupils: Pupils are equal, round, and reactive to light.  Cardiovascular:     Rate and Rhythm: Normal rate and regular rhythm.     Heart sounds: No murmur heard. Pulmonary:     Effort: No respiratory distress.     Breath sounds: Normal breath sounds.  Musculoskeletal:     Cervical back: Normal range of motion and neck supple.  Neurological:     Mental Status: He is alert and oriented to person, place, and time.    Physical Exam   VITALS: BP- 90/60 NECK: No cervical lymphadenopathy. No thyromegaly. Salivary glands slightly prominent. CHEST: Lungs clear to auscultation bilaterally. CARDIOVASCULAR: Regular rate and rhythm, no murmurs. ABDOMEN: Abdomen soft, non-tender, no masses. EXTREMITIES: No pedal edema.          Assessment & Plan:  Type 2  diabetes mellitus with other specified complication, without long-term current use of insulin  (HCC) -     Bayer DCA Hb A1c Waived -     CBC with Differential/Platelet -     CMP14+EGFR -     Microalbumin / creatinine urine ratio  Essential hypertension -     CBC with Differential/Platelet -     CMP14+EGFR  Hyperlipidemia, unspecified hyperlipidemia type -     Lipid panel  Other orders -     cloNIDine  HCl; Take 1 tablet (0.2 mg total) by mouth 2 (two) times daily.  Dispense: 180 tablet; Refill: 3  Assessment and Plan    Hypertension Hypertension management complicated by hypotension and dizziness, likely due to clonidine . Clonidine  identified as contributing to symptoms. - Reduce clonidine  dosage from 0.3 mg to 0.2 mg twice daily. - Educated on medication adherence to prevent rebound hypertension.  Hyperkalemia Previous hyperkalemia with adverse reaction to Lokelma . - Order serum potassium level to reassess current status. - Avoid Lokelma  due to adverse  reaction.  Type 2 diabetes mellitus A1c at 6.2% indicates good control. - Continue current dietary modifications focusing on low-carb, high-protein meals. - Schedule follow-up in 3 months to reassess diabetes management.        Follow-up: Return in about 3 months (around 12/04/2023).  Butler Der, M.D.

## 2023-09-04 LAB — LIPID PANEL

## 2023-09-04 LAB — MICROALBUMIN / CREATININE URINE RATIO
Creatinine, Urine: 148.9 mg/dL
Microalb/Creat Ratio: 73 mg/g{creat} — ABNORMAL HIGH (ref 0–29)
Microalbumin, Urine: 109.2 ug/mL

## 2023-09-05 LAB — CBC WITH DIFFERENTIAL/PLATELET
Basophils Absolute: 0.1 x10E3/uL (ref 0.0–0.2)
Basos: 1 %
EOS (ABSOLUTE): 0.3 x10E3/uL (ref 0.0–0.4)
Eos: 3 %
Hematocrit: 35.1 % — ABNORMAL LOW (ref 37.5–51.0)
Hemoglobin: 11 g/dL — ABNORMAL LOW (ref 13.0–17.7)
Immature Grans (Abs): 0 x10E3/uL (ref 0.0–0.1)
Immature Granulocytes: 0 %
Lymphocytes Absolute: 1.4 x10E3/uL (ref 0.7–3.1)
Lymphs: 17 %
MCH: 30.1 pg (ref 26.6–33.0)
MCHC: 31.3 g/dL — ABNORMAL LOW (ref 31.5–35.7)
MCV: 96 fL (ref 79–97)
Monocytes Absolute: 0.6 x10E3/uL (ref 0.1–0.9)
Monocytes: 7 %
Neutrophils Absolute: 5.7 x10E3/uL (ref 1.4–7.0)
Neutrophils: 72 %
Platelets: 426 x10E3/uL (ref 150–450)
RBC: 3.66 x10E6/uL — ABNORMAL LOW (ref 4.14–5.80)
RDW: 12.5 % (ref 11.6–15.4)
WBC: 7.9 x10E3/uL (ref 3.4–10.8)

## 2023-09-05 LAB — CMP14+EGFR
ALT: 11 IU/L (ref 0–44)
AST: 18 IU/L (ref 0–40)
Albumin: 4.1 g/dL (ref 3.9–4.9)
Alkaline Phosphatase: 104 IU/L (ref 44–121)
BUN/Creatinine Ratio: 12 (ref 10–24)
BUN: 25 mg/dL (ref 8–27)
Bilirubin Total: 0.2 mg/dL (ref 0.0–1.2)
CO2: 20 mmol/L (ref 20–29)
Calcium: 9.8 mg/dL (ref 8.6–10.2)
Chloride: 102 mmol/L (ref 96–106)
Creatinine, Ser: 2.08 mg/dL — AB (ref 0.76–1.27)
Globulin, Total: 2.8 g/dL (ref 1.5–4.5)
Glucose: 161 mg/dL — AB (ref 70–99)
Potassium: 5.7 mmol/L — AB (ref 3.5–5.2)
Sodium: 137 mmol/L (ref 134–144)
Total Protein: 6.9 g/dL (ref 6.0–8.5)
eGFR: 34 mL/min/1.73 — AB (ref >59)

## 2023-09-05 LAB — LIPID PANEL
Cholesterol, Total: 136 mg/dL (ref 100–199)
HDL: 27 mg/dL — AB (ref >39)
LDL CALC COMMENT:: 5 ratio (ref 0.0–5.0)
LDL Chol Calc (NIH): 78 mg/dL (ref 0–99)
Triglycerides: 181 mg/dL — AB (ref 0–149)
VLDL Cholesterol Cal: 31 mg/dL (ref 5–40)

## 2023-09-10 NOTE — Progress Notes (Signed)
Hello Jabron,  Your lab result is normal and/or stable.Some minor variations that are not significant are commonly marked abnormal, but do not represent any medical problem for you.  Best regards, Emunah Texidor, M.D.

## 2023-09-14 ENCOUNTER — Other Ambulatory Visit (HOSPITAL_COMMUNITY): Payer: Self-pay

## 2023-10-12 ENCOUNTER — Other Ambulatory Visit (HOSPITAL_COMMUNITY): Payer: Self-pay

## 2023-11-06 ENCOUNTER — Other Ambulatory Visit: Payer: Self-pay | Admitting: Family Medicine

## 2023-11-06 ENCOUNTER — Other Ambulatory Visit: Payer: Self-pay

## 2023-11-06 ENCOUNTER — Other Ambulatory Visit (HOSPITAL_COMMUNITY): Payer: Self-pay

## 2023-11-06 DIAGNOSIS — E1169 Type 2 diabetes mellitus with other specified complication: Secondary | ICD-10-CM

## 2023-11-06 DIAGNOSIS — E559 Vitamin D deficiency, unspecified: Secondary | ICD-10-CM

## 2023-11-06 DIAGNOSIS — E785 Hyperlipidemia, unspecified: Secondary | ICD-10-CM

## 2023-11-06 DIAGNOSIS — I10 Essential (primary) hypertension: Secondary | ICD-10-CM

## 2023-11-06 MED ORDER — METFORMIN HCL ER 750 MG PO TB24
750.0000 mg | ORAL_TABLET | Freq: Two times a day (BID) | ORAL | 0 refills | Status: DC
  Filled 2023-11-06: qty 180, 90d supply, fill #0

## 2023-11-06 MED ORDER — VITAMIN D (ERGOCALCIFEROL) 1.25 MG (50000 UNIT) PO CAPS
50000.0000 [IU] | ORAL_CAPSULE | ORAL | 0 refills | Status: AC
  Filled 2023-11-06 – 2023-11-20 (×3): qty 12, 84d supply, fill #0

## 2023-11-06 MED ORDER — SPIRONOLACTONE 25 MG PO TABS
25.0000 mg | ORAL_TABLET | Freq: Every day | ORAL | 0 refills | Status: AC
  Filled 2023-11-06 – 2024-01-09 (×2): qty 90, 90d supply, fill #0

## 2023-11-06 MED ORDER — ATORVASTATIN CALCIUM 20 MG PO TABS
20.0000 mg | ORAL_TABLET | Freq: Every day | ORAL | 0 refills | Status: AC
  Filled 2023-11-06 – 2023-11-19 (×2): qty 90, 90d supply, fill #0

## 2023-11-07 ENCOUNTER — Other Ambulatory Visit: Payer: Self-pay

## 2023-11-19 ENCOUNTER — Other Ambulatory Visit (HOSPITAL_COMMUNITY): Payer: Self-pay

## 2023-11-19 ENCOUNTER — Other Ambulatory Visit: Payer: Self-pay

## 2023-11-20 ENCOUNTER — Other Ambulatory Visit (HOSPITAL_COMMUNITY): Payer: Self-pay

## 2023-11-20 ENCOUNTER — Other Ambulatory Visit: Payer: Self-pay

## 2023-12-06 ENCOUNTER — Other Ambulatory Visit: Payer: Self-pay | Admitting: Family Medicine

## 2023-12-06 ENCOUNTER — Other Ambulatory Visit (HOSPITAL_COMMUNITY): Payer: Self-pay

## 2023-12-06 DIAGNOSIS — E1169 Type 2 diabetes mellitus with other specified complication: Secondary | ICD-10-CM

## 2023-12-07 ENCOUNTER — Other Ambulatory Visit (HOSPITAL_COMMUNITY): Payer: Self-pay

## 2023-12-07 ENCOUNTER — Other Ambulatory Visit: Payer: Self-pay

## 2023-12-07 MED ORDER — SITAGLIPTIN PHOSPHATE 100 MG PO TABS
100.0000 mg | ORAL_TABLET | Freq: Every day | ORAL | 0 refills | Status: DC
  Filled 2023-12-07: qty 30, 30d supply, fill #0
  Filled 2024-01-09: qty 30, 30d supply, fill #1
  Filled 2024-02-07: qty 30, 30d supply, fill #2

## 2023-12-08 ENCOUNTER — Other Ambulatory Visit (HOSPITAL_COMMUNITY): Payer: Self-pay

## 2023-12-08 ENCOUNTER — Other Ambulatory Visit: Payer: Self-pay | Admitting: Family Medicine

## 2023-12-08 DIAGNOSIS — I10 Essential (primary) hypertension: Secondary | ICD-10-CM

## 2023-12-10 ENCOUNTER — Other Ambulatory Visit (HOSPITAL_COMMUNITY): Payer: Self-pay

## 2023-12-10 ENCOUNTER — Other Ambulatory Visit: Payer: Self-pay

## 2023-12-10 MED ORDER — CARVEDILOL 25 MG PO TABS
25.0000 mg | ORAL_TABLET | Freq: Two times a day (BID) | ORAL | 0 refills | Status: AC
  Filled 2023-12-10: qty 180, 90d supply, fill #0

## 2023-12-17 ENCOUNTER — Other Ambulatory Visit (HOSPITAL_COMMUNITY): Payer: Self-pay

## 2023-12-17 ENCOUNTER — Other Ambulatory Visit: Payer: Self-pay | Admitting: Family Medicine

## 2023-12-17 ENCOUNTER — Other Ambulatory Visit: Payer: Self-pay

## 2023-12-17 DIAGNOSIS — I10 Essential (primary) hypertension: Secondary | ICD-10-CM

## 2023-12-17 MED ORDER — TERAZOSIN HCL 5 MG PO CAPS
5.0000 mg | ORAL_CAPSULE | Freq: Every day | ORAL | 0 refills | Status: AC
  Filled 2023-12-17: qty 90, 90d supply, fill #0

## 2023-12-24 ENCOUNTER — Ambulatory Visit: Payer: Self-pay | Admitting: Family Medicine

## 2024-01-09 ENCOUNTER — Other Ambulatory Visit (HOSPITAL_COMMUNITY): Payer: Self-pay

## 2024-01-24 ENCOUNTER — Other Ambulatory Visit: Payer: Self-pay

## 2024-01-24 ENCOUNTER — Other Ambulatory Visit (HOSPITAL_COMMUNITY): Payer: Self-pay

## 2024-02-07 ENCOUNTER — Other Ambulatory Visit: Payer: Self-pay | Admitting: Family Medicine

## 2024-02-07 DIAGNOSIS — E1169 Type 2 diabetes mellitus with other specified complication: Secondary | ICD-10-CM

## 2024-02-07 MED ORDER — METFORMIN HCL ER 750 MG PO TB24
750.0000 mg | ORAL_TABLET | Freq: Two times a day (BID) | ORAL | 0 refills | Status: AC
  Filled 2024-02-07: qty 180, 90d supply, fill #0

## 2024-02-08 ENCOUNTER — Other Ambulatory Visit: Payer: Self-pay

## 2024-02-08 ENCOUNTER — Other Ambulatory Visit (HOSPITAL_COMMUNITY): Payer: Self-pay

## 2024-02-08 LAB — OPHTHALMOLOGY REPORT-SCANNED

## 2024-02-11 ENCOUNTER — Other Ambulatory Visit (HOSPITAL_COMMUNITY): Payer: Self-pay

## 2024-02-11 ENCOUNTER — Other Ambulatory Visit: Payer: Self-pay | Admitting: Family Medicine

## 2024-02-11 DIAGNOSIS — E1169 Type 2 diabetes mellitus with other specified complication: Secondary | ICD-10-CM

## 2024-02-11 MED ORDER — SITAGLIPTIN PHOSPHATE 100 MG PO TABS
100.0000 mg | ORAL_TABLET | Freq: Every day | ORAL | 0 refills | Status: AC
  Filled 2024-02-11: qty 90, 90d supply, fill #0

## 2024-02-12 ENCOUNTER — Other Ambulatory Visit (HOSPITAL_COMMUNITY): Payer: Self-pay

## 2024-02-12 ENCOUNTER — Other Ambulatory Visit: Payer: Self-pay

## 2024-03-24 ENCOUNTER — Ambulatory Visit: Admitting: Family Medicine
